# Patient Record
Sex: Female | Born: 1950 | Race: White | Hispanic: No | Marital: Married | Smoking: Never smoker
Health system: Southern US, Community
[De-identification: ages and names within clinical notes are randomized; demographics above are authoritative.]

## PROBLEM LIST (undated history)

## (undated) DIAGNOSIS — M8580 Other specified disorders of bone density and structure, unspecified site: Secondary | ICD-10-CM

## (undated) DIAGNOSIS — M1990 Unspecified osteoarthritis, unspecified site: Secondary | ICD-10-CM

## (undated) DIAGNOSIS — F419 Anxiety disorder, unspecified: Secondary | ICD-10-CM

## (undated) DIAGNOSIS — F32A Depression, unspecified: Secondary | ICD-10-CM

## (undated) DIAGNOSIS — G5793 Unspecified mononeuropathy of bilateral lower limbs: Secondary | ICD-10-CM

## (undated) DIAGNOSIS — F329 Major depressive disorder, single episode, unspecified: Secondary | ICD-10-CM

## (undated) DIAGNOSIS — F338 Other recurrent depressive disorders: Secondary | ICD-10-CM

## (undated) DIAGNOSIS — R7303 Prediabetes: Secondary | ICD-10-CM

## (undated) HISTORY — PX: APPENDECTOMY: SHX54

---

## 2005-06-21 ENCOUNTER — Ambulatory Visit: Payer: Self-pay | Admitting: Unknown Physician Specialty

## 2008-07-07 ENCOUNTER — Ambulatory Visit: Payer: Self-pay | Admitting: Internal Medicine

## 2009-07-20 ENCOUNTER — Ambulatory Visit: Payer: Self-pay | Admitting: Internal Medicine

## 2010-09-18 ENCOUNTER — Ambulatory Visit: Payer: Self-pay | Admitting: Internal Medicine

## 2011-11-13 ENCOUNTER — Ambulatory Visit: Payer: Self-pay | Admitting: Internal Medicine

## 2013-06-22 ENCOUNTER — Ambulatory Visit: Payer: Self-pay | Admitting: Family Medicine

## 2013-12-29 ENCOUNTER — Emergency Department: Payer: Self-pay | Admitting: Emergency Medicine

## 2013-12-29 LAB — URINALYSIS, COMPLETE
BACTERIA: NONE SEEN
Bilirubin,UR: NEGATIVE
Blood: NEGATIVE
Glucose,UR: NEGATIVE mg/dL (ref 0–75)
KETONE: NEGATIVE
Leukocyte Esterase: NEGATIVE
Nitrite: NEGATIVE
Ph: 8 (ref 4.5–8.0)
Protein: NEGATIVE
RBC,UR: 2 /HPF (ref 0–5)
SPECIFIC GRAVITY: 1.014 (ref 1.003–1.030)
Squamous Epithelial: 1
WBC UR: 1 /HPF (ref 0–5)

## 2014-07-19 ENCOUNTER — Other Ambulatory Visit: Payer: Self-pay | Admitting: Family Medicine

## 2014-07-19 DIAGNOSIS — Z1231 Encounter for screening mammogram for malignant neoplasm of breast: Secondary | ICD-10-CM

## 2014-08-02 ENCOUNTER — Other Ambulatory Visit: Payer: Self-pay | Admitting: Family Medicine

## 2014-08-02 ENCOUNTER — Ambulatory Visit
Admission: RE | Admit: 2014-08-02 | Discharge: 2014-08-02 | Disposition: A | Discharge status: Home / Self Care | Payer: Medicare PPO | Source: Ambulatory Visit | Attending: Family Medicine | Admitting: Family Medicine

## 2014-08-02 DIAGNOSIS — Z1231 Encounter for screening mammogram for malignant neoplasm of breast: Secondary | ICD-10-CM | POA: Insufficient documentation

## 2015-06-19 ENCOUNTER — Ambulatory Visit: Payer: Medicare Other | Admitting: Anesthesiology

## 2015-06-19 ENCOUNTER — Encounter: Payer: Self-pay | Admitting: *Deleted

## 2015-06-19 ENCOUNTER — Ambulatory Visit
Admission: RE | Admit: 2015-06-19 | Discharge: 2015-06-19 | Disposition: A | Discharge status: Home / Self Care | Payer: Medicare Other | Source: Ambulatory Visit | Attending: Unknown Physician Specialty | Admitting: Unknown Physician Specialty

## 2015-06-19 ENCOUNTER — Encounter
Admission: RE | Disposition: A | Discharge status: Home / Self Care | Payer: Self-pay | Source: Ambulatory Visit | Attending: Unknown Physician Specialty

## 2015-06-19 DIAGNOSIS — Z791 Long term (current) use of non-steroidal anti-inflammatories (NSAID): Secondary | ICD-10-CM | POA: Insufficient documentation

## 2015-06-19 DIAGNOSIS — D123 Benign neoplasm of transverse colon: Secondary | ICD-10-CM | POA: Insufficient documentation

## 2015-06-19 DIAGNOSIS — Z79899 Other long term (current) drug therapy: Secondary | ICD-10-CM | POA: Diagnosis not present

## 2015-06-19 DIAGNOSIS — K641 Second degree hemorrhoids: Secondary | ICD-10-CM | POA: Insufficient documentation

## 2015-06-19 DIAGNOSIS — Z1211 Encounter for screening for malignant neoplasm of colon: Secondary | ICD-10-CM | POA: Insufficient documentation

## 2015-06-19 DIAGNOSIS — F419 Anxiety disorder, unspecified: Secondary | ICD-10-CM | POA: Diagnosis not present

## 2015-06-19 DIAGNOSIS — Z9049 Acquired absence of other specified parts of digestive tract: Secondary | ICD-10-CM | POA: Diagnosis not present

## 2015-06-19 DIAGNOSIS — M8580 Other specified disorders of bone density and structure, unspecified site: Secondary | ICD-10-CM | POA: Insufficient documentation

## 2015-06-19 DIAGNOSIS — F339 Major depressive disorder, recurrent, unspecified: Secondary | ICD-10-CM | POA: Diagnosis not present

## 2015-06-19 HISTORY — DX: Depression, unspecified: F32.A

## 2015-06-19 HISTORY — DX: Unspecified osteoarthritis, unspecified site: M19.90

## 2015-06-19 HISTORY — DX: Other recurrent depressive disorders: F33.8

## 2015-06-19 HISTORY — DX: Other specified disorders of bone density and structure, unspecified site: M85.80

## 2015-06-19 HISTORY — DX: Anxiety disorder, unspecified: F41.9

## 2015-06-19 HISTORY — PX: COLONOSCOPY WITH PROPOFOL: SHX5780

## 2015-06-19 HISTORY — DX: Major depressive disorder, single episode, unspecified: F32.9

## 2015-06-19 SURGERY — COLONOSCOPY WITH PROPOFOL
Anesthesia: General

## 2015-06-19 MED ORDER — PROPOFOL 500 MG/50ML IV EMUL
INTRAVENOUS | Status: DC | PRN
  Administered 2015-06-19: 125 ug/kg/min via INTRAVENOUS

## 2015-06-19 MED ORDER — SODIUM CHLORIDE 0.9 % IV SOLN: INTRAVENOUS | Status: DC

## 2015-06-19 MED ORDER — SODIUM CHLORIDE 0.9 % IV SOLN
INTRAVENOUS | Status: DC
  Administered 2015-06-19: 1000 mL via INTRAVENOUS

## 2015-06-19 MED ORDER — PROPOFOL 10 MG/ML IV BOLUS
INTRAVENOUS | Status: DC | PRN
  Administered 2015-06-19: 75 mg via INTRAVENOUS

## 2015-06-19 MED ORDER — LACTATED RINGERS IV SOLN
INTRAVENOUS | Status: DC | PRN
  Administered 2015-06-19: 13:00:00 via INTRAVENOUS

## 2015-06-19 NOTE — Anesthesia Preprocedure Evaluation (Signed)
Anesthesia Evaluation  Patient identified by MRN, date of birth, ID band Patient awake    Reviewed: Allergy & Precautions, H&P , NPO status , Patient's Chart, lab work & pertinent test results, reviewed documented beta blocker date and time   Airway Mallampati: III   Neck ROM: full    Dental  (+) Poor Dentition, Teeth Intact   Pulmonary neg pulmonary ROS,    Pulmonary exam normal        Cardiovascular negative cardio ROS Normal cardiovascular exam Rate:Normal     Neuro/Psych PSYCHIATRIC DISORDERS negative neurological ROS  negative psych ROS   GI/Hepatic negative GI ROS, Neg liver ROS,   Endo/Other  negative endocrine ROS  Renal/GU negative Renal ROS  negative genitourinary   Musculoskeletal   Abdominal   Peds  Hematology negative hematology ROS (+)   Anesthesia Other Findings Past Medical History:   Depression                                                   Anxiety                                                      DJD (degenerative joint disease)                             Osteopenia                                                   Seasonal depression (HCC)                                  Past Surgical History:   APPENDECTOMY                                                  Reproductive/Obstetrics                             Anesthesia Physical Anesthesia Plan  ASA: III  Anesthesia Plan: General   Post-op Pain Management:    Induction:   Airway Management Planned:   Additional Equipment:   Intra-op Plan:   Post-operative Plan:   Informed Consent: I have reviewed the patients History and Physical, chart, labs and discussed the procedure including the risks, benefits and alternatives for the proposed anesthesia with the patient or authorized representative who has indicated his/her understanding and acceptance.   Dental Advisory Given  Plan Discussed with:  CRNA  Anesthesia Plan Comments:         Anesthesia Quick Evaluation

## 2015-06-19 NOTE — H&P (Signed)
   Primary Care Physician:  Marcello Fennel, MD Primary Gastroenterologist:  Dr. Vira Agar  Pre-Procedure History & Physical: HPI:  Monica Everett is a 65 y.o. female is here for an colonoscopy.   Past Medical History  Diagnosis Date  . Depression   . Anxiety   . DJD (degenerative joint disease)   . Osteopenia   . Seasonal depression [facility])     Past Surgical History  Procedure Laterality Date  . Appendectomy      Prior to Admission medications   Medication Sig Start Date End Date Taking? Authorizing Provider  Biotin 1 MG CAPS Take by mouth.   Yes Historical Provider, MD  cetirizine (ZYRTEC) 10 MG tablet Take 10 mg by mouth daily.   Yes Historical Provider, MD  citalopram (CELEXA) 10 MG tablet Take 10 mg by mouth daily.   Yes Historical Provider, MD  meloxicam (MOBIC) 15 MG tablet Take 15 mg by mouth daily.   Yes Historical Provider, MD  multivitamin-iron-minerals-folic acid (CENTRUM) chewable tablet Chew 1 tablet by mouth daily.   Yes Historical Provider, MD    Allergies as of 06/03/2015  . (Not on File)    History reviewed. No pertinent family history.  Social History   Social History  . Marital Status: Married    Spouse Name: N/A  . Number of Children: N/A  . Years of Education: N/A   Occupational History  . Not on file.   Social History Main Topics  . Smoking status: Never Smoker   . Smokeless tobacco: Never Used  . Alcohol Use: 3.6 oz/week    6 Glasses of wine per week  . Drug Use: No  . Sexual Activity: Not on file   Other Topics Concern  . Not on file   Social History Narrative    Review of Systems: See HPI, otherwise negative ROS  Physical Exam: BP 122/76 mmHg  Pulse 67  Temp(Src) 98 F (36.7 C) (Oral)  Resp 18  Ht 5\' 5"  (1.651 m)  Wt 67.132 kg (148 lb)  BMI 24.63 kg/m2  SpO2 98% General:   Alert,  pleasant and cooperative in NAD Head:  Normocephalic and atraumatic. Neck:  Supple; no masses or thyromegaly. Lungs:  Clear throughout to  auscultation.    Heart:  Regular rate and rhythm. Abdomen:  Soft, nontender and nondistended. Normal bowel sounds, without guarding, and without rebound.   Neurologic:  Alert and  oriented x4;  grossly normal neurologically.  Impression/Plan: Monica Everett is here for an colonoscopy to be performed for Screening  Risks, benefits, limitations, and alternatives regarding  colonoscopy have been reviewed with the patient.  Questions have been answered.  All parties agreeable.   Gaylyn Cheers, MD  06/19/2015, 1:29 PM

## 2015-06-19 NOTE — Op Note (Signed)
[facility] Gastroenterology Patient Name: Monica Everett Procedure Date: 06/19/2015 1:30 PM MRN: [id number] Account #: [id number] Date of Birth: 1950-04-28 Admit Type: Outpatient Age: 65 Room: [facility] ENDO ROOM 1 Gender: Female Note Status: Finalized Procedure:            Colonoscopy Indications:          Screening for colorectal malignant neoplasm Providers:            Manya Silvas, MD Referring MD:         Caprice Renshaw (Referring MD) Medicines:            Propofol per Anesthesia Complications:        No immediate complications. Procedure:            Pre-Anesthesia Assessment:                       - After reviewing the risks and benefits, the patient                        was deemed in satisfactory condition to undergo the                        procedure.                       After obtaining informed consent, the colonoscope was                        passed under direct vision. Throughout the procedure,                        the patient's blood pressure, pulse, and oxygen                        saturations were monitored continuously. The                        Colonoscope was introduced through the anus and                        advanced to the the cecum, identified by appendiceal                        orifice and ileocecal valve. The colonoscopy was                        performed without difficulty. The patient tolerated the                        procedure well. The quality of the bowel preparation                        was good. Findings:      A diminutive polyp was found in the transverse colon. The polyp was       sessile. The polyp was removed with a jumbo cold forceps. Resection and       retrieval were complete.      Internal hemorrhoids were found during endoscopy. The hemorrhoids were       small and Grade I (internal hemorrhoids that do not prolapse).      The exam was  otherwise without abnormality. Impression:           - One  diminutive polyp in the transverse colon, removed                        with a jumbo cold forceps. Resected and retrieved.                       - Internal hemorrhoids.                       - The examination was otherwise normal. Recommendation:       - Await pathology results. Manya Silvas, MD 06/19/2015 1:58:17 PM This report has been signed electronically. Number of Addenda: 0 Note Initiated On: 06/19/2015 1:30 PM Scope Withdrawal Time: 0 hours 14 minutes 51 seconds  Total Procedure Duration: 0 hours 21 minutes 5 seconds       [facility]

## 2015-06-19 NOTE — Anesthesia Postprocedure Evaluation (Signed)
Anesthesia Post Note  Patient: Monica Everett  Procedure(s) Performed: Procedure(s) (LRB): COLONOSCOPY WITH PROPOFOL (N/A)  Patient location during evaluation: PACU Anesthesia Type: General Level of consciousness: awake Pain management: pain level controlled Vital Signs Assessment: post-procedure vital signs reviewed and stable Respiratory status: spontaneous breathing Cardiovascular status: blood pressure returned to baseline Anesthetic complications: no    Last Vitals:  Filed Vitals:   06/19/15 1420 06/19/15 1430  BP: 132/84 143/76  Pulse: 69 59  Temp:    Resp: 17 12    Last Pain:  Filed Vitals:   06/19/15 1432  PainSc: 2                  VAN STAVEREN,Maytal Mijangos

## 2015-06-19 NOTE — Transfer of Care (Signed)
Immediate Anesthesia Transfer of Care Note  Patient: Monica Everett  Procedure(s) Performed: Procedure(s): COLONOSCOPY WITH PROPOFOL (N/A)  Patient Location: PACU and Endoscopy Unit  Anesthesia Type:General  Level of Consciousness: awake, alert  and oriented  Airway & Oxygen Therapy: Patient Spontanous Breathing and Patient connected to nasal cannula oxygen  Post-op Assessment: Report given to RN and Post -op Vital signs reviewed and stable  Post vital signs: Reviewed and stable  Last Vitals:  Filed Vitals:   06/19/15 1259  BP: 122/76  Pulse: 67  Temp: 36.7 C  Resp: 18    Complications: No apparent anesthesia complications

## 2015-06-20 LAB — SURGICAL PATHOLOGY

## 2015-06-23 ENCOUNTER — Other Ambulatory Visit: Payer: Self-pay | Admitting: Family Medicine

## 2015-06-23 DIAGNOSIS — Z1231 Encounter for screening mammogram for malignant neoplasm of breast: Secondary | ICD-10-CM

## 2015-08-03 ENCOUNTER — Other Ambulatory Visit: Payer: Self-pay | Admitting: Family Medicine

## 2015-08-03 ENCOUNTER — Ambulatory Visit
Admission: RE | Admit: 2015-08-03 | Discharge: 2015-08-03 | Disposition: A | Discharge status: Home / Self Care | Payer: Medicare Other | Source: Ambulatory Visit | Attending: Family Medicine | Admitting: Family Medicine

## 2015-08-03 DIAGNOSIS — Z1231 Encounter for screening mammogram for malignant neoplasm of breast: Secondary | ICD-10-CM

## 2016-07-15 ENCOUNTER — Other Ambulatory Visit: Payer: Self-pay | Admitting: Family Medicine

## 2016-07-15 DIAGNOSIS — Z1231 Encounter for screening mammogram for malignant neoplasm of breast: Secondary | ICD-10-CM

## 2016-08-06 ENCOUNTER — Ambulatory Visit
Admission: RE | Admit: 2016-08-06 | Discharge: 2016-08-06 | Disposition: A | Discharge status: Home / Self Care | Payer: Medicare Other | Source: Ambulatory Visit | Attending: Family Medicine | Admitting: Family Medicine

## 2016-08-06 DIAGNOSIS — Z1231 Encounter for screening mammogram for malignant neoplasm of breast: Secondary | ICD-10-CM | POA: Insufficient documentation

## 2016-10-08 ENCOUNTER — Ambulatory Visit: Payer: Medicare Other | Attending: Family Medicine | Admitting: Physical Therapy

## 2016-10-08 ENCOUNTER — Encounter: Payer: Self-pay | Admitting: Physical Therapy

## 2016-10-08 DIAGNOSIS — M25511 Pain in right shoulder: Secondary | ICD-10-CM | POA: Insufficient documentation

## 2016-10-08 DIAGNOSIS — M6281 Muscle weakness (generalized): Secondary | ICD-10-CM | POA: Insufficient documentation

## 2016-10-09 NOTE — Therapy (Signed)
[facility] [address] [place], [address] Phone: [phone]   Fax:  [phone]  Physical Therapy Evaluation  Patient Details  Name: MINTIE WITHERINGTON MRN: [id number] Date of Birth: [birth year] Referring Provider: Derinda Late MD  Encounter Date: 10/08/2016      PT End of Session - 10/08/16 1233    Visit Number 1   Number of Visits 12   Date for PT Re-Evaluation 11/19/16   Authorization Type 1   Authorization Time Period 10 G code   PT Start Time 1117   PT Stop Time 1213   PT Time Calculation (min) 56 min   Activity Tolerance Patient tolerated treatment well   Behavior During Therapy [facility] for tasks assessed/performed      Past Medical History:  Diagnosis Date  . Anxiety   . Depression   . DJD (degenerative joint disease)   . Osteopenia   . Seasonal depression ([place])     Past Surgical History:  Procedure Laterality Date  . APPENDECTOMY    . COLONOSCOPY WITH PROPOFOL N/A 06/19/2015   Procedure: COLONOSCOPY WITH PROPOFOL;  Surgeon: Manya Silvas, MD;  Location: [facility] ENDOSCOPY;  Service: Endoscopy;  Laterality: N/A;    There were no vitals filed for this visit.       Subjective Assessment - 10/08/16 1130    Subjective pain in right shoulder that is improving since injection last week. still feels weak and can't put right arm behind back as well as her left.    Pertinent History ~ 66month ago patient reports gradual onset of pain in right shoulder for no apparent reason. She has been self managing pain until seen by MD and received an injection with good results. No previous history of shoulder pain.    Limitations Lifting;House hold activities;Other (comment)  placing right arm behind back   Patient Stated Goals improve strength to be able to return to exercises at gym and to know what to do to not re injure right shoulder   Currently in Pain? No/denies  Pain ranges from 0/10 up to 4/10             [facility] PT Assessment - 10/08/16 1131      Assessment   Medical Diagnosis Acute pain of right shoulder M25.511   Referring Provider Derinda Late MD   Onset Date/Surgical Date 09/01/16   Hand Dominance Right   Next MD Visit none   Prior Therapy none for this episode right shoulder     Precautions   Precautions None     Balance Screen   Has the patient fallen in the past 6 months No     Prior Function   Level of Independence Independent   Vocation Retired   Leisure exercise, reading, taking care of dog     Cognition   Overall Cognitive Status Within Functional Limits for tasks assessed     Observation/Other Assessments   Quick DASH  14% self perceived disability (mild)     Sensation   Light Touch Appears Intact     Posture/Postural Control   Posture Comments mild forward head with rounded shoulders,      ROM / Strength   AROM / PROM / Strength AROM;Strength     AROM   Overall AROM Comments limited IR right shoulder behind back, all other AROM WNL right and left UE     Strength   Overall Strength Comments left UE 5/5 major muscle groups, right  shoulder decreased ER 4/5; decreased scapular control, stability with lower trapezius     Palpation   Palpation comment + muscle spasms palpable right shoulder posterior aspect over Teres muscles and latissimus muscles     Neer Impingement test    Findings Negative   Side Right     Empty Can test   Findings Negative   Side Right            Objective measurements completed on examination: See above findings.    Treatment: Therapeutic exercises: instruction with VC, tactile cues, demonstration of therapist: goal: improve Quick Dash score, independent with home program Supine lying: Serratus punches x 10 Scapular retraction x 10 Chest press, overhead raise with 3# weight x 15 reps Standing: Bilateral shoulder rows high with green resistive band x 10 reps Bilateral shoulder extension to hips x 10  with green resistive band x 10 reps  Manual therapy: goal: improve soft tissue elasticity, decreased spasms, pain STM performed to right shoulder upper trapezius and posterior aspect of right shoulder with patient sitting  Patient response to treatment: patient demonstrated improved technique with exercises with minimal VC for correct alignment.  Improved motor control with repetition and cuing.         PT Education - 10/08/16 1226    Education provided Yes   Education Details POC; HEP: scpapular retraction, scapular rows with green resistive band high and shoulder extension to hips, precautions for impingment   Person(s) Educated Patient   Methods Explanation;Demonstration;Verbal cues;Handout   Comprehension Verbalized understanding;Returned demonstration;Verbal cues required             PT Long Term Goals - 10/08/16 1200      PT LONG TERM GOAL #1   Title Patient will demonstrate improved function and decreased pain in shoulder as indicated by QuickDash score of 10% or less   Baseline QuickDash 14%   Status New   Target Date 11/19/16     PT LONG TERM GOAL #2   Title Patient will be independent with home program for pain control, exercises for flexibility and strength to allow transition to self management once discharged from physical therapy   Baseline limited knowledge of pain control, appropriate exercises and progression without assistance and cuing   Status New   Target Date 11/19/16                Plan - 10/08/16 1235    Clinical Impression Statement Patient is a 66 year old right hand dominant female who presents with right shoulder pain x ~1 month and is improving at this time. She has limited IR behind back and pain that limits full functional use of right UE. Patient has decreased strength right shoulder scapular control, lower trapezius muscle, external rotator muscles. Her QuickDash score of  14% indicating mild self pervceived disabiltiy. She has  limited knowledge of appropriate precautions, exercises or progression and will benefit from physical therapy intervention to achieve prior level of function.    History and Personal Factors relevant to plan of care: gradual onset of pain right shoulder with limited motion, function that is improving since onset and injection received a week ago.    Clinical Presentation Stable   Clinical Presentation due to: improving at this time, not worsening   Clinical Decision Making Low   Rehab Potential Good   Clinical Impairments Affecting Rehab Potential (+)motivated, prior level of function, acute condition   PT Frequency Other (comment)  1-2x/week   PT Duration 6 weeks  PT Treatment/Interventions Manual techniques;Patient/family education;Neuromuscular re-education;Therapeutic exercise;Moist Heat;Ultrasound;Iontophoresis 4mg /ml Dexamethasone;Electrical Stimulation;Cryotherapy   PT Next Visit Plan pain control, neuromuscular re education, progressive exercise   PT Home Exercise Plan posture awareness, scapular retraction with/without resistance, scapular punches and flexion overhead supine lying   Consulted and Agree with Plan of Care Patient      Patient will benefit from skilled therapeutic intervention in order to improve the following deficits and impairments:  Decreased strength, Impaired perceived functional ability, Pain, Increased muscle spasms, Decreased range of motion, Impaired UE functional use  Visit Diagnosis: Acute pain of right shoulder - Plan: PT plan of care cert/re-cert  Muscle weakness (generalized) - Plan: PT plan of care cert/re-cert      G-Codes - 07/62/26 1247    Functional Assessment Tool Used (Outpatient Only) quickDash, ROM, strength, pain, clinical judgment   Functional Limitation Carrying, moving and handling objects   Carrying, Moving and Handling Objects Current Status (J3354) At least 20 percent but less than 40 percent impaired, limited or restricted    Carrying, Moving and Handling Objects Goal Status (T6256) At least 1 percent but less than 20 percent impaired, limited or restricted       Problem List There are no active problems to display for this patient.   Jomarie Longs PT 10/09/2016, 6:27 PM  [facility] [address] [place], [address] Phone: [phone]   Fax:  [phone]  Name: ANALIZA COWGER MRN: [id number] Date of Birth: [birth year]

## 2016-10-10 ENCOUNTER — Ambulatory Visit: Payer: Medicare Other | Admitting: Physical Therapy

## 2016-10-10 ENCOUNTER — Encounter: Payer: Self-pay | Admitting: Physical Therapy

## 2016-10-10 DIAGNOSIS — M25511 Pain in right shoulder: Secondary | ICD-10-CM | POA: Diagnosis not present

## 2016-10-10 DIAGNOSIS — M6281 Muscle weakness (generalized): Secondary | ICD-10-CM

## 2016-10-11 NOTE — Therapy (Signed)
[facility] [address], [place], [address] Phone: [phone]   Fax:  [phone]  Physical Therapy Treatment  Patient Details  Name: Monica Everett MRN: [id number] Date of Birth: Nov 15, 1950 Referring Provider: Derinda Late MD  Encounter Date: 10/10/2016      PT End of Session - 10/10/16 1203    Visit Number 2   Number of Visits 12   Date for PT Re-Evaluation 11/19/16   Authorization Type 2   Authorization Time Period 10 G code   PT Start Time 1120   PT Stop Time 1149   PT Time Calculation (min) 29 min   Activity Tolerance Patient tolerated treatment well   Behavior During Therapy [facility] for tasks assessed/performed      Past Medical History:  Diagnosis Date  . Anxiety   . Depression   . DJD (degenerative joint disease)   . Osteopenia   . Seasonal depression ([place])     Past Surgical History:  Procedure Laterality Date  . APPENDECTOMY    . COLONOSCOPY WITH PROPOFOL N/A 06/19/2015   Procedure: COLONOSCOPY WITH PROPOFOL;  Surgeon: Manya Silvas, MD;  Location: [facility] ENDOSCOPY;  Service: Endoscopy;  Laterality: N/A;    There were no vitals filed for this visit.      Subjective Assessment - 10/10/16 1125    Subjective Patient reports she is sore 2/10 today and has been doing cleaning at home and water aerobics 3x/week; modified exercises with good results   Pertinent History ~ 87month ago patient reports gradual onset of pain in right shoulder for no apparent reason. She has been self managing pain until seen by MD and received an injection with good results. No previous history of shoulder pain.    Limitations Lifting;House hold activities;Other (comment)  placing right arm behind back   Patient Stated Goals improve strength to be able to return to exercises at gym and to know what to do to not re injure right shoulder   Currently in Pain? Yes   Pain Score 2    Pain Location Shoulder   Pain  Orientation Right   Pain Descriptors / Indicators Aching;Dull   Pain Type Acute pain   Pain Onset 1 to 4 weeks ago   Pain Frequency Intermittent        Objective: Posture: forward shoulder right as compared to left, left scapula higher than right with spasms in upper trapezius Strength: decreased periscapular motor control right and left with decreased strength lower trapezius and rhomboids  Therapeutic exercise: patient performed exercises with verbal, tactile cues and demonstration of therapist: Standing: Resistive band scapular rows at door green resistive band 15 reps Straight arm pull downs to hips x 15 reps Scapular retraction at door frame x 10 with VC for improved control through full ROM Supine lying chest press with 3# weight x 15 Forward flexion supine with 3# weight x 15 Serratus punches following scapular control exercises to improve proper technique and shoulder alignment 10 reps Side lying left and right for scapular control exercises with manual resistance, cuing to perform through full ROM with good alignment 10 reps each of elevation/depression and protraction/retraction   Patient response to treatment: patient demonstrated improved technique with exercises with moderate tactile and VC for correct alignment. Patient with decreased pain from   2/10 to  0/10.  Improved motor control with repetition and cuing           PT Education - 10/10/16 1128  Education provided Yes   Education Details HEP re assessed with resistive bands   Person(s) Educated Patient   Methods Explanation;Demonstration;Verbal cues   Comprehension Verbalized understanding;Returned demonstration;Verbal cues required             PT Long Term Goals - 10/08/16 1200      PT LONG TERM GOAL #1   Title Patient will demonstrate improved function and decreased pain in shoulder as indicated by QuickDash score of 10% or less   Baseline QuickDash 14%   Status New   Target Date 11/19/16      PT LONG TERM GOAL #2   Title Patient will be independent with home program for pain control, exercises for flexibility and strength to allow transition to self management once discharged from physical therapy   Baseline limited knowledge of pain control, appropriate exercises and progression without assistance and cuing   Status New   Target Date 11/19/16               Plan - 10/10/16 1200    Clinical Impression Statement Patient demonstrated improved posture, alignment with exercises with repetition and repeated demonstration. she is progressing towards goals and should continue to improve with additional physical therapy intervention to address functional limitaitons.    Rehab Potential Good   Clinical Impairments Affecting Rehab Potential (+)motivated, prior level of function, acute condition   PT Frequency Other (comment)  1-2x/week   PT Duration 6 weeks   PT Treatment/Interventions Manual techniques;Patient/family education;Neuromuscular re-education;Therapeutic exercise;Moist Heat;Ultrasound;Iontophoresis 4mg /ml Dexamethasone;Electrical Stimulation;Cryotherapy   PT Next Visit Plan pain control, neuromuscular re education, progressive exercise   PT Home Exercise Plan posture awareness, scapular retraction with/without resistance, scapular punches and flexion overhead supine lying      Patient will benefit from skilled therapeutic intervention in order to improve the following deficits and impairments:  Decreased strength, Impaired perceived functional ability, Pain, Increased muscle spasms, Decreased range of motion, Impaired UE functional use  Visit Diagnosis: Acute pain of right shoulder  Muscle weakness (generalized)     Problem List There are no active problems to display for this patient.   Jomarie Longs PT 10/11/2016, 10:26 AM  [facility] [address], [place], [address] Phone:  [phone]   Fax:  [phone]  Name: Monica Everett MRN: [id number] Date of Birth: [birth year]

## 2016-10-14 ENCOUNTER — Ambulatory Visit: Payer: Medicare Other | Admitting: Physical Therapy

## 2016-10-15 ENCOUNTER — Ambulatory Visit: Payer: Medicare Other | Admitting: Physical Therapy

## 2016-10-15 ENCOUNTER — Encounter: Payer: Self-pay | Admitting: Physical Therapy

## 2016-10-15 DIAGNOSIS — M25511 Pain in right shoulder: Secondary | ICD-10-CM

## 2016-10-15 DIAGNOSIS — M6281 Muscle weakness (generalized): Secondary | ICD-10-CM

## 2016-10-16 ENCOUNTER — Encounter: Payer: Medicare PPO | Admitting: Physical Therapy

## 2016-10-16 NOTE — Therapy (Signed)
[facility] [address] [place], [address] Phone: [phone]   Fax:  [phone]  Physical Therapy Treatment  Patient Details  Name: Monica Everett MRN: [id number] Date of Birth: 11-17-1950 Referring Provider: Derinda Late MD  Encounter Date: 10/15/2016      PT End of Session - 10/15/16 1230    Visit Number 3   Number of Visits 12   Date for PT Re-Evaluation 11/19/16   Authorization Type 3   Authorization Time Period 10 G code   PT Start Time 1118   PT Stop Time 1200   PT Time Calculation (min) 42 min   Activity Tolerance Patient tolerated treatment well   Behavior During Therapy [facility] for tasks assessed/performed      Past Medical History:  Diagnosis Date  . Anxiety   . Depression   . DJD (degenerative joint disease)   . Osteopenia   . Seasonal depression ([place])     Past Surgical History:  Procedure Laterality Date  . APPENDECTOMY    . COLONOSCOPY WITH PROPOFOL N/A 06/19/2015   Procedure: COLONOSCOPY WITH PROPOFOL;  Surgeon: Manya Silvas, MD;  Location: [facility] ENDOSCOPY;  Service: Endoscopy;  Laterality: N/A;    There were no vitals filed for this visit.      Subjective Assessment - 10/15/16 1120    Subjective Patient reports she is continuing to exercise and is seeing improvement with all aspects of function.    Pertinent History ~ 7month ago patient reports gradual onset of pain in right shoulder for no apparent reason. She has been self managing pain until seen by MD and received an injection with good results. No previous history of shoulder pain.    Limitations Lifting;House hold activities;Other (comment)  placing right arm behind back   Patient Stated Goals improve strength to be able to return to exercises at gym and to know what to do to not re injure right shoulder   Currently in Pain? Yes   Pain Score 1    Pain Location Shoulder   Pain Orientation Right   Pain  Descriptors / Indicators Sore   Pain Type Acute pain   Pain Onset 1 to 4 weeks ago   Pain Frequency Intermittent        Objective: Posture: forward shoulder right as compared to left, left scapula higher than right with spasms in upper trapezius Palpation; + spasms right shoulder medial border of scapula and along lats/teres muscles Strength: decreased periscapular motor control right and left with decreased strength lower trapezius and rhomboids  Treatment:  Manual therapy: 15 min.: goal: spasms/pain STM with superficial techniques performed to right scapula/rhomboids with mobilization of scapula with patient side lying left; superficial techniques; supine lying performed STM to lats. Teres muscles in conjunction with exercises to decrease pain and improve mobility   Therapeutic exercise: patient performed exercises with verbal, tactile cues and demonstration of therapist: Goal: independent with home program, improve QuickDash Standing at [facility] cable machine: required VC, tactile cues to perform with correct shoulder alignment and decrease substitution scapular rows 10# bilateral 15 reps Straight arm pull downs to hips 15# x 15 reps  At wall:  Partial ROM push ups with controlled motion and hand placement lower to decrease right shoulder pain 10 reps  Scapular retraction at door frame x 10 with VC for improved control through full ROM Supine lying chest press with 3# weight x 15 Forward flexion supine with 3# weight x 15  Side lying right for left  scapular control exercises with manual resistance, cuing to perform through full ROM with good alignment 10 reps protraction/retraction   Patient response to treatment: Patient with decreased spasms and pain by 50% following STM to right shoulder girdle with decreased tenderness along medial border of scapula and improved motor control with exercises.  Improved motor control with all exercises with repetition and cuing          PT  Education - 10/15/16 1233    Education provided Yes   Education Details exercise instruction for technique, alignment    Person(s) Educated Patient   Methods Explanation;Demonstration;Verbal cues;Tactile cues   Comprehension Verbalized understanding;Returned demonstration;Verbal cues required             PT Long Term Goals - 10/08/16 1200      PT LONG TERM GOAL #1   Title Patient will demonstrate improved function and decreased pain in shoulder as indicated by QuickDash score of 10% or less   Baseline QuickDash 14%   Status New   Target Date 11/19/16     PT LONG TERM GOAL #2   Title Patient will be independent with home program for pain control, exercises for flexibility and strength to allow transition to self management once discharged from physical therapy   Baseline limited knowledge of pain control, appropriate exercises and progression without assistance and cuing   Status New   Target Date 11/19/16               Plan - 10/15/16 1218    Clinical Impression Statement Patient demonstrates improvement with posture, technique and motor control with moderate cuing and STM for decreasing spasms. She is progressing steadily towards goals and will benefit from continued physical therapy intervention to achieve goals.    Rehab Potential Good   Clinical Impairments Affecting Rehab Potential (+)motivated, prior level of function, acute condition   PT Frequency Other (comment)  1-2x/week   PT Duration 6 weeks   PT Treatment/Interventions Manual techniques;Patient/family education;Neuromuscular re-education;Therapeutic exercise;Moist Heat;Ultrasound;Iontophoresis 4mg /ml Dexamethasone;Electrical Stimulation;Cryotherapy   PT Next Visit Plan pain control, neuromuscular re education, progressive exercise   PT Home Exercise Plan posture awareness, scapular retraction with/without resistance, scapular punches and flexion overhead supine lying      Patient will benefit from  skilled therapeutic intervention in order to improve the following deficits and impairments:  Decreased strength, Impaired perceived functional ability, Pain, Increased muscle spasms, Decreased range of motion, Impaired UE functional use  Visit Diagnosis: Acute pain of right shoulder  Muscle weakness (generalized)     Problem List There are no active problems to display for this patient.   Jomarie Longs PT 10/16/2016, 4:23 PM  [facility] [address] [place], [address] Phone: [phone]   Fax:  [phone]  Name: Monica Everett MRN: [id number] Date of Birth: 01/02/1951

## 2016-10-17 ENCOUNTER — Encounter: Payer: Self-pay | Admitting: Physical Therapy

## 2016-10-17 ENCOUNTER — Ambulatory Visit: Payer: Medicare Other | Admitting: Physical Therapy

## 2016-10-17 DIAGNOSIS — M25511 Pain in right shoulder: Secondary | ICD-10-CM | POA: Diagnosis not present

## 2016-10-17 DIAGNOSIS — M6281 Muscle weakness (generalized): Secondary | ICD-10-CM

## 2016-10-17 NOTE — Therapy (Signed)
[facility] [address], [place], [address] Phone: [phone]   Fax:  [phone]  Physical Therapy Treatment  Patient Details  Name: Monica Everett MRN: [id number] Date of Birth: 11/27/1950 Referring Provider: Derinda Late MD  Encounter Date: 10/17/2016      PT End of Session - 10/17/16 1213    Visit Number 4   Number of Visits 12   Date for PT Re-Evaluation 11/19/16   Authorization Type 4   Authorization Time Period 10 G code   PT Start Time 1122   PT Stop Time 1205   PT Time Calculation (min) 43 min   Activity Tolerance Patient tolerated treatment well   Behavior During Therapy [facility] for tasks assessed/performed      Past Medical History:  Diagnosis Date  . Anxiety   . Depression   . DJD (degenerative joint disease)   . Osteopenia   . Seasonal depression ([place])     Past Surgical History:  Procedure Laterality Date  . APPENDECTOMY    . COLONOSCOPY WITH PROPOFOL N/A 06/19/2015   Procedure: COLONOSCOPY WITH PROPOFOL;  Surgeon: Manya Silvas, MD;  Location: [facility] ENDOSCOPY;  Service: Endoscopy;  Laterality: N/A;    There were no vitals filed for this visit.      Subjective Assessment - 10/17/16 1124    Subjective Patient reprots she is exercising and is continuing to see improvement    Pertinent History ~ 9month ago patient reports gradual onset of pain in right shoulder for no apparent reason. She has been self managing pain until seen by MD and received an injection with good results. No previous history of shoulder pain.    Limitations Lifting;House hold activities;Other (comment)  placing right arm behind back   Patient Stated Goals improve strength to be able to return to exercises at gym and to know what to do to not re injure right shoulder   Currently in Pain? Yes   Pain Score 1    Pain Location Shoulder   Pain Orientation Right   Pain Descriptors / Indicators Sore   Pain  Type Acute pain   Pain Onset 1 to 4 weeks ago   Pain Frequency Intermittent       Objective: Posture: forward shoulder right as compared to left Palpation; + spasms right shoulder medial border of scapula decreased as compared to previous session and improved scapular mobility noted Strength: decreased periscapular motor control right and left with decreased strength lower trapezius and rhomboids  Treatment:  Manual therapy: in conjunction with exercises:  STM with superficial techniques performed to right scapula/rhomboids with mobilization of scapula with patient side lying left; superficial techniques Therapeutic exercise: patient performed exercises with verbal, tactile cues and demonstration of therapist: Goal: independent with home program, improve QuickDash [employer] cable machine: required VC, tactile cues to perform with correct shoulder alignment and decrease substitution Seated scapular rows 10# bilateral 15 reps Straight arm pull downs standing: to hips 15# x 15 reps  At wall:  Partial ROM push ups with controlled motion and hand placement lower to decrease right shoulder pain 10 reps  Scapular retraction at door frame x 10 with VC for improved control through full ROM  Side lying right for left  scapular control exercises with manual resistance, cuing to perform through full ROM with good alignment 10 reps protraction/retraction   Modalities: Electrical stimulation: 15 min. Russian stim. 10/10 cycle applied (4) electrodes to right shoulder: (2)posterior aspect over  external rotators and (2)along scapula and lower trapezius muscle:  goal muscle re education   Patient response to treatment: Patient demonstrated improved motor control and technique with repetition and tactile, verbal cuing. Patient reported no pain at end of session and verbalized good understanding of home program to encourage scapular control, retraction right shoulder with exercises and daily tasks.                 PT Education - 10/17/16 1200    Education provided Yes   Education Details exercise instruction with facilitation of correct muscles   Person(s) Educated Patient   Methods Explanation;Demonstration;Tactile cues;Verbal cues   Comprehension Verbalized understanding;Returned demonstration;Verbal cues required             PT Long Term Goals - 10/08/16 1200      PT LONG TERM GOAL #1   Title Patient will demonstrate improved function and decreased pain in shoulder as indicated by QuickDash score of 10% or less   Baseline QuickDash 14%   Status New   Target Date 11/19/16     PT LONG TERM GOAL #2   Title Patient will be independent with home program for pain control, exercises for flexibility and strength to allow transition to self management once discharged from physical therapy   Baseline limited knowledge of pain control, appropriate exercises and progression without assistance and cuing   Status New   Target Date 11/19/16               Plan - 10/17/16 1230    Clinical Impression Statement Patient demonstrates steady improvement towards goals with improving strength, motor control and functional use with less difficulty. she continues to require instruction and moderate cuing to perform exercises with appropriate intensity and technique to activate weaker muscles.    Rehab Potential Good   Clinical Impairments Affecting Rehab Potential (+)motivated, prior level of function, acute condition   PT Frequency Other (comment)  1-2x/week   PT Duration 6 weeks   PT Treatment/Interventions Manual techniques;Patient/family education;Neuromuscular re-education;Therapeutic exercise;Moist Heat;Ultrasound;Iontophoresis 4mg /ml Dexamethasone;Electrical Stimulation;Cryotherapy   PT Next Visit Plan pain control, neuromuscular re education, progressive exercise   PT Home Exercise Plan posture awareness, scapular retraction with/without resistance, scapular punches  and flexion overhead supine lying      Patient will benefit from skilled therapeutic intervention in order to improve the following deficits and impairments:  Decreased strength, Impaired perceived functional ability, Pain, Increased muscle spasms, Decreased range of motion, Impaired UE functional use  Visit Diagnosis: Acute pain of right shoulder  Muscle weakness (generalized)     Problem List There are no active problems to display for this patient.   Jomarie Longs PT 10/18/2016, 12:08 PM  [facility] [address] [place], [address] Phone: [phone]   Fax:  [phone]  Name: CANDY LEVERETT MRN: [id number] Date of Birth: 1950/08/13

## 2016-10-22 ENCOUNTER — Encounter: Payer: Medicare PPO | Admitting: Physical Therapy

## 2016-10-22 ENCOUNTER — Ambulatory Visit: Payer: Medicare Other | Admitting: Physical Therapy

## 2016-10-29 ENCOUNTER — Encounter: Payer: Self-pay | Admitting: Physical Therapy

## 2016-10-29 ENCOUNTER — Encounter: Payer: Medicare PPO | Admitting: Physical Therapy

## 2016-10-29 ENCOUNTER — Ambulatory Visit: Payer: Medicare Other | Admitting: Physical Therapy

## 2016-10-29 DIAGNOSIS — M25511 Pain in right shoulder: Secondary | ICD-10-CM

## 2016-10-29 DIAGNOSIS — M6281 Muscle weakness (generalized): Secondary | ICD-10-CM

## 2016-10-29 NOTE — Therapy (Signed)
[facility] [address] [place], [address] Phone: [phone]   Fax:  [phone]  Physical Therapy Treatment  Patient Details  Name: Monica Everett MRN: [id number] Date of Birth: 1950-12-03 Referring Provider: Derinda Late MD  Encounter Date: 10/29/2016      PT End of Session - 10/29/16 1733    Visit Number 5   Number of Visits 12   Date for PT Re-Evaluation 11/19/16   Authorization Type 5   Authorization Time Period 10 G code   PT Start Time 2585   PT Stop Time 1435   PT Time Calculation (min) 42 min   Activity Tolerance Patient tolerated treatment well   Behavior During Therapy [facility] for tasks assessed/performed      Past Medical History:  Diagnosis Date  . Anxiety   . Depression   . DJD (degenerative joint disease)   . Osteopenia   . Seasonal depression ([place])     Past Surgical History:  Procedure Laterality Date  . APPENDECTOMY    . COLONOSCOPY WITH PROPOFOL N/A 06/19/2015   Procedure: COLONOSCOPY WITH PROPOFOL;  Surgeon: Manya Silvas, MD;  Location: [facility] ENDOSCOPY;  Service: Endoscopy;  Laterality: N/A;    There were no vitals filed for this visit.      Subjective Assessment - 10/29/16 1357    Subjective Patient reports she is exercising and is continuing to see improvement with increasing strength and functional use right UE.   Pertinent History ~ 25month ago patient reports gradual onset of pain in right shoulder for no apparent reason. She has been self managing pain until seen by MD and received an injection with good results. No previous history of shoulder pain.    Limitations Lifting;House hold activities;Other (comment)  placing right arm behind back   Patient Stated Goals improve strength to be able to return to exercises at gym and to know what to do to not re injure right shoulder   Currently in Pain? No/denies      Objective: Strength: decreased periscapular motor  control right and left with decreased strength lower trapezius and rhomboids  Treatment:  Therapeutic exercise: patient performed exercises with verbal, tactile cues and demonstration of therapist: Goal: independent with home program, improve QuickDash OME[employer] cable machine: required VC, tactile cues to perform with correct shoulder alignment and decrease substitution Seated scapular rows 15# bilateral 15 reps Straight arm pull downs standing: to hips 15# x 15 reps Reverse seated chin ups 15# x 15 reps Body blade with small blade chest press x 30 seconds, press down in front x 30 seconds  At wall:  Partial ROM push ups with controlled motion and hand placement lower to decrease right shoulder pain 10 reps ER bilateral shoulders with towel under each arm: ER palms up and perpendular holding (2) 3# weights x 10 reps each  Lower trapezius raise off wall, 10x each UE then 10x bilateral UE's  Modalities: Electrical stimulation: 15 min. Russian stim. 10/10 cycle applied (4) electrodes to right shoulder: (2)posterior aspect over external rotators and (2)along scapula and lower trapezius muscle:  goal muscle re education   Patient response to treatment: Patient demonstrated improved technique with exercises with minimal cuing. No adverse effects with estim., improved muscle contraction with estim.        PT Education - 10/29/16 1732    Education provided Yes   Education Details exercise instruction with correct technique and to facilitate correct muscles   Person(s)  Educated Patient   Methods Explanation;Demonstration;Tactile cues;Verbal cues   Comprehension Verbalized understanding;Returned demonstration;Verbal cues required             PT Long Term Goals - 10/08/16 1200      PT LONG TERM GOAL #1   Title Patient will demonstrate improved function and decreased pain in shoulder as indicated by QuickDash score of 10% or less   Baseline QuickDash 14%   Status New   Target  Date 11/19/16     PT LONG TERM GOAL #2   Title Patient will be independent with home program for pain control, exercises for flexibility and strength to allow transition to self management once discharged from physical therapy   Baseline limited knowledge of pain control, appropriate exercises and progression without assistance and cuing   Status New   Target Date 11/19/16               Plan - 10/29/16 1435    Clinical Impression Statement Patieint demosntrates steady improvement towards goals with improving strength, motor control and functional use with less difficulty. She continues to require instruction, guidance and moderate cuing to perform exercises.    Rehab Potential Good   Clinical Impairments Affecting Rehab Potential (+)motivated, prior level of function, acute condition   PT Frequency Other (comment)  1-2x/week   PT Duration 6 weeks   PT Treatment/Interventions Manual techniques;Patient/family education;Neuromuscular re-education;Therapeutic exercise;Moist Heat;Ultrasound;Iontophoresis 4mg /ml Dexamethasone;Electrical Stimulation;Cryotherapy   PT Next Visit Plan pain control, neuromuscular re education, progressive exercise   PT Home Exercise Plan posture awareness, scapular retraction with/without resistance, scapular punches and flexion overhead supine lying      Patient will benefit from skilled therapeutic intervention in order to improve the following deficits and impairments:  Decreased strength, Impaired perceived functional ability, Pain, Increased muscle spasms, Decreased range of motion, Impaired UE functional use  Visit Diagnosis: Acute pain of right shoulder  Muscle weakness (generalized)     Problem List There are no active problems to display for this patient.   Jomarie Longs PT 10/29/2016, 5:44 PM  [facility] [address] [place], [address] Phone: [phone]   Fax:   [phone]  Name: Monica Everett MRN: [id number] Date of Birth: [birth year]

## 2016-10-31 ENCOUNTER — Encounter: Payer: Self-pay | Admitting: Physical Therapy

## 2016-10-31 ENCOUNTER — Encounter: Payer: Medicare PPO | Admitting: Physical Therapy

## 2016-10-31 ENCOUNTER — Ambulatory Visit: Payer: Medicare Other | Admitting: Physical Therapy

## 2016-10-31 DIAGNOSIS — M6281 Muscle weakness (generalized): Secondary | ICD-10-CM

## 2016-10-31 DIAGNOSIS — M25511 Pain in right shoulder: Secondary | ICD-10-CM | POA: Diagnosis not present

## 2016-10-31 NOTE — Therapy (Signed)
[place] [address] [place], [address] Phone: [phone]   Fax:  [phone]  Physical Therapy Treatment  Patient Details  Name: Monica Everett MRN: [id number] Date of Birth: 1950-04-18 Referring Provider: Derinda Late MD  Encounter Date: 10/31/2016      PT End of Session - 10/31/16 1350    Visit Number 6   Number of Visits 12   Date for PT Re-Evaluation 11/19/16   Authorization Type 6   Authorization Time Period 10 G code   PT Start Time 5681   PT Stop Time 1434   PT Time Calculation (min) 49 min   Activity Tolerance Patient tolerated treatment well   Behavior During Therapy [facility] for tasks assessed/performed      Past Medical History:  Diagnosis Date  . Anxiety   . Depression   . DJD (degenerative joint disease)   . Osteopenia   . Seasonal depression ([facility])     Past Surgical History:  Procedure Laterality Date  . APPENDECTOMY    . COLONOSCOPY WITH PROPOFOL N/A 06/19/2015   Procedure: COLONOSCOPY WITH PROPOFOL;  Surgeon: Manya Silvas, MD;  Location: [facility] ENDOSCOPY;  Service: Endoscopy;  Laterality: N/A;    There were no vitals filed for this visit.      Subjective Assessment - 10/31/16 1348    Subjective patient reports she did well following previous session and is still having aching in right shoulder with certain movements and weight bearing.    Pertinent History ~ 62month ago patient reports gradual onset of pain in right shoulder for no apparent reason. She has been self managing pain until seen by MD and received an injection with good results. No previous history of shoulder pain.    Limitations Lifting;House hold activities;Other (comment)  placing right arm behind back   Patient Stated Goals improve strength to be able to return to exercises at gym and to know what to do to not re injure right shoulder   Currently in Pain? No/denies      Objective: Strength:  decreased periscapular motor control right and left with decreased strength lower trapezius and rhomboids  Treatment:  Therapeutic exercise: patient performed exercises with verbal, tactile cues and demonstration of therapist: Goal: independent with home program, improve QuickDash OMEGA cable machine: required VC, tactile cues to perform with correct shoulder alignment and decrease substitution Seated scapular rows 15# bilateral 15 reps Straight arm pull downs standing: to hips 15# x 15 reps Reverse seated chin ups 15# x 15 reps Body blade with small blade chest press x 30 seconds, press down in front x 30 seconds Stretches for triceps, "tree hug" 3 reps x 10 seconds  At wall:  ER bilateral shoulders with towel under each arm: ER palms up and perpendular holding (2) 2# weights x 10 reps each   Side lying left: performed with RIGHT shoulder with assist of therapist: Shoulder elevation and depression and protraction and retraction (with moderate manual resistance) x 10 reps each  Modalities: Electrical stimulation: 15 min.Russian stim. 10/10 cycle applied (4) electrodes to right shoulder: (2)posterior aspect over external rotators and (2)along scapula and lower trapezius muscle: goal muscle re education   Patient response to treatment: Patient demonstrated improved technique with exercises with minimal cuing.     Patient education: Yes: exercise instruction, stretching Methods: demonstration, explanation, VC Patient returned demonstration with VC          PT Long Term Goals - 10/08/16 1200  PT LONG TERM GOAL #1   Title Patient will demonstrate improved function and decreased pain in shoulder as indicated by QuickDash score of 10% or less   Baseline QuickDash 14%   Status New   Target Date 11/19/16     PT LONG TERM GOAL #2   Title Patient will be independent with home program for pain control, exercises for flexibility and strength to allow transition to self  management once discharged from physical therapy   Baseline limited knowledge of pain control, appropriate exercises and progression without assistance and cuing   Status New   Target Date 11/19/16               Plan - 10/31/16 1436    Clinical Impression Statement Patient demonstrates steady improvement towards goals with improving strength, motor control and functional use right UE with less difficulty.    Rehab Potential Good   Clinical Impairments Affecting Rehab Potential (+)motivated, prior level of function, acute condition   PT Frequency Other (comment)  1-2x/week   PT Duration 6 weeks   PT Treatment/Interventions Manual techniques;Patient/family education;Neuromuscular re-education;Therapeutic exercise;Moist Heat;Ultrasound;Iontophoresis 4mg /ml Dexamethasone;Electrical Stimulation;Cryotherapy   PT Next Visit Plan pain control, neuromuscular re education, progressive exercise   PT Home Exercise Plan posture awareness, scapular retraction with/without resistance, scapular punches and flexion overhead supine lying      Patient will benefit from skilled therapeutic intervention in order to improve the following deficits and impairments:  Decreased strength, Impaired perceived functional ability, Pain, Increased muscle spasms, Decreased range of motion, Impaired UE functional use  Visit Diagnosis: Acute pain of right shoulder  Muscle weakness (generalized)     Problem List There are no active problems to display for this patient.   Jomarie Longs PT 11/01/2016, 9:42 AM  [place] [facility] [address] [place], [address] Phone: [phone]   Fax:  [phone]  Name: Monica Everett MRN: [id number] Date of Birth: 10-21-1950

## 2016-11-05 ENCOUNTER — Ambulatory Visit: Payer: Medicare Other | Attending: Family Medicine | Admitting: Physical Therapy

## 2016-11-05 ENCOUNTER — Encounter: Payer: Self-pay | Admitting: Physical Therapy

## 2016-11-05 DIAGNOSIS — M25511 Pain in right shoulder: Secondary | ICD-10-CM | POA: Diagnosis present

## 2016-11-05 DIAGNOSIS — M6281 Muscle weakness (generalized): Secondary | ICD-10-CM | POA: Diagnosis not present

## 2016-11-05 NOTE — Therapy (Signed)
[place] [facility] [address], [place], [address] Phone: [phone]   Fax:  [phone]  Physical Therapy Treatment  Patient Details  Name: Monica Everett MRN: [id number] Date of Birth: August 23, 1950 Referring Provider: Derinda Late MD  Encounter Date: 11/05/2016      PT End of Session - 11/05/16 1308    Visit Number 7   Number of Visits 12   Date for PT Re-Evaluation 11/19/16   Authorization Type 7   Authorization Time Period 10 G code   PT Start Time 1302   PT Stop Time 1345   PT Time Calculation (min) 43 min   Activity Tolerance Patient tolerated treatment well   Behavior During Therapy [facility] for tasks assessed/performed      Past Medical History:  Diagnosis Date  . Anxiety   . Depression   . DJD (degenerative joint disease)   . Osteopenia   . Seasonal depression ([place])     Past Surgical History:  Procedure Laterality Date  . APPENDECTOMY    . COLONOSCOPY WITH PROPOFOL N/A 06/19/2015   Procedure: COLONOSCOPY WITH PROPOFOL;  Surgeon: Manya Silvas, MD;  Location: [facility] ENDOSCOPY;  Service: Endoscopy;  Laterality: N/A;    There were no vitals filed for this visit.      Subjective Assessment - 11/05/16 1307    Subjective Feels no discomfort at rest and only mild discomfort with movement and certain exercises.    Pertinent History ~ 55month ago patient reports gradual onset of pain in right shoulder for no apparent reason. She has been self managing pain until seen by MD and received an injection with good results. No previous history of shoulder pain.    Limitations Lifting;House hold activities;Other (comment)  placing right arm behind back   Patient Stated Goals improve strength to be able to return to exercises at gym and to know what to do to not re injure right shoulder   Currently in Pain? No/denies      Objective: Palpation: moderate spasms with increased tenderness on palpation right  posterior shoulder latissimus and infraspinatus, teres muscles  Treatment:  Manual therapy: goal: decrease spasms, improve soft tissue elasticity  In conjunction with exercise:  STM performed to right shoulder lats and infraspinatus, teres muscles with compression technique and superficial techniques performed with patient in supine and left side lying; scapular mobilization performed right shoulder with patient side lying left   Therapeutic exercise: patient performed exercises with verbal, tactile cues and demonstration of therapist: Goal: independent with home program, improve QuickDash OMEGA cable machine: required VC, tactile cues to perform with correct shoulder alignment and decrease substitution Seated scapular rows 15# bilateral 15 reps Single arm row 5# x 10 reps each UE Straight arm pull downs standing: to hips 15# x 15 reps Reverse seated chin ups 15# x 15 reps Body blade with small blade chest press x 30 seconds, press down in front x 30 seconds Stretches for triceps, "tree hug" 3 reps x 10 seconds  At wall:  ER bilateral shoulders with towel under each arm: ER palms up x 20 and perpendular  x 15 reps each holding (2) 1# weights  Supine lying: performed with assistance of therapist: Right UE Rhythmic stabilization with right shoulder at 90 degrees flexion 2 x 10 reps flexion/extension moderate resistance Rhythmic stabilization for rotations with shoulder in neutral and arm at side 2 x 10 reps moderate resistance given  Modalities: Electrical stimulation: 15 min.Russian stim. 10/10  cycle applied (4) electrodes to right shoulder: (2)posterior aspect over external rotators and (2)along scapula and lower trapezius muscle: goal muscle re education   Patient response to treatment: Improved soft tissue elasticity with decreased spasms to mild and much less tender to palpation, prior to exercise. Improved technique, positioning with exercises with minimal to moderate cuing and  demonstration. Mild fatigue noted with ER exercises at wall. Improved muscle contraction and muscle re education following estim.           PT Education - 11/05/16 1308    Education provided Yes   Education Details exercise instruction for correct technique   Person(s) Educated Patient   Methods Explanation;Demonstration;Verbal cues   Comprehension Verbalized understanding;Returned demonstration;Verbal cues required             PT Long Term Goals - 10/08/16 1200      PT LONG TERM GOAL #1   Title Patient will demonstrate improved function and decreased pain in shoulder as indicated by QuickDash score of 10% or less   Baseline QuickDash 14%   Status New   Target Date 11/19/16     PT LONG TERM GOAL #2   Title Patient will be independent with home program for pain control, exercises for flexibility and strength to allow transition to self management once discharged from physical therapy   Baseline limited knowledge of pain control, appropriate exercises and progression without assistance and cuing   Status New   Target Date 11/19/16               Plan - 11/05/16 1343    Clinical Impression Statement Patient continues to improve motor control and strength right shoulder with current treatment with improvement noted in functional activities at home. She continues with decreased strength in right shoulder and spasms and will benefit from additional physicla therapy intervention to achieve goals.    Rehab Potential Good   Clinical Impairments Affecting Rehab Potential (+)motivated, prior level of function, acute condition   PT Frequency Other (comment)  1-2x/week   PT Duration 6 weeks   PT Treatment/Interventions Manual techniques;Patient/family education;Neuromuscular re-education;Therapeutic exercise;Moist Heat;Ultrasound;Iontophoresis 4mg /ml Dexamethasone;Electrical Stimulation;Cryotherapy   PT Next Visit Plan pain control, neuromuscular re education, progressive  exercise   PT Home Exercise Plan posture awareness, scapular retraction with/without resistance, scapular punches and flexion overhead supine lying      Patient will benefit from skilled therapeutic intervention in order to improve the following deficits and impairments:  Decreased strength, Impaired perceived functional ability, Pain, Increased muscle spasms, Decreased range of motion, Impaired UE functional use  Visit Diagnosis: Muscle weakness (generalized)  Acute pain of right shoulder     Problem List There are no active problems to display for this patient.   Jomarie Longs PT 11/06/2016, 7:36 PM  [place] [facility] [address], [place], [address] Phone: [phone]   Fax:  [phone]  Name: BLOSSOM CRUME MRN: [id number] Date of Birth: Jul 22, 1950

## 2016-11-07 ENCOUNTER — Ambulatory Visit: Payer: Medicare Other | Admitting: Physical Therapy

## 2016-11-07 ENCOUNTER — Encounter: Payer: Self-pay | Admitting: Physical Therapy

## 2016-11-07 DIAGNOSIS — M6281 Muscle weakness (generalized): Secondary | ICD-10-CM

## 2016-11-07 DIAGNOSIS — M25511 Pain in right shoulder: Secondary | ICD-10-CM

## 2016-11-07 NOTE — Therapy (Signed)
[facility] [address], [place], [address] Phone: [phone]   Fax:  [phone]  Physical Therapy Treatment  Patient Details  Name: Monica Everett MRN: [id number] Date of Birth: [birth year] Referring Provider: Derinda Late MD  Encounter Date: 11/07/2016      PT End of Session - 11/07/16 1306    Visit Number 8   Number of Visits 12   Date for PT Re-Evaluation 11/19/16   Authorization Type 8   Authorization Time Period 10 G code   PT Start Time 6734   PT Stop Time 1345   PT Time Calculation (min) 40 min   Activity Tolerance Patient tolerated treatment well   Behavior During Therapy [facility] for tasks assessed/performed      Past Medical History:  Diagnosis Date  . Anxiety   . Depression   . DJD (degenerative joint disease)   . Osteopenia   . Seasonal depression ([place])     Past Surgical History:  Procedure Laterality Date  . APPENDECTOMY    . COLONOSCOPY WITH PROPOFOL N/A 06/19/2015   Procedure: COLONOSCOPY WITH PROPOFOL;  Surgeon: Manya Silvas, MD;  Location: [facility] ENDOSCOPY;  Service: Endoscopy;  Laterality: N/A;    There were no vitals filed for this visit.      Subjective Assessment - 11/07/16 1305    Subjective Feels no discomfort at rest and only mild discomfort with movement and certain exercises.    Pertinent History ~ 45month ago patient reports gradual onset of pain in right shoulder for no apparent reason. She has been self managing pain until seen by MD and received an injection with good results. No previous history of shoulder pain.    Limitations Lifting;House hold activities;Other (comment)  placing right arm behind back   Patient Stated Goals improve strength to be able to return to exercises at gym and to know what to do to not re injure right shoulder   Currently in Pain? No/denies         Objective: Strength: improving periscapular muscle strength and motor control,  improving right shoulder/UE strength as demonstrated during exercises  Treatment:   Therapeutic exercise: patient performed exercises with verbal, tactile cues and demonstration of therapist: Goal: independent with home program, improve QuickDash OMEGA cable machine: required VC, tactile cues to perform with correct shoulder alignment and decrease substitution Seated scapular rows 15# bilateral 15 reps Single arm row 5# x 10 reps each UE Straight arm pull downs standing: to hips 15# x 15 reps Reverse seated chin ups 15# x 15 reps Body blade with small blade chest press x 30 seconds, press down in front x 30 seconds Stretches for triceps, "tree hug" 3 reps x 10 seconds  Supine lying: performed with assistance of therapist: Right UE Rhythmic stabilization with right shoulder at 90 degrees flexion 2 x 10 reps flexion/extension moderate resistance Rhythmic stabilization for rotations with shoulder in neutral and arm at side 2 x 10 reps moderate resistance given  Modalities: Electrical stimulation: 20 min.Russian stim. 10/10 cycle applied (4) electrodes to right shoulder: (2)posterior aspect over external rotators and (2)along scapula and lower trapezius muscle, intensity to contraciton: goal muscle re education   Patient response to treatment: patient demonstrated improved technique with exercises with minimal VC for correct alignment. Improved motor control with repetition and cuing       PT Education - 11/07/16 1306    Education provided Yes   Education Details exercise instruction  Person(s) Educated Patient   Methods Explanation   Comprehension Verbalized understanding             PT Long Term Goals - 10/08/16 1200      PT LONG TERM GOAL #1   Title Patient will demonstrate improved function and decreased pain in shoulder as indicated by QuickDash score of 10% or less   Baseline QuickDash 14%   Status New   Target Date 11/19/16     PT LONG TERM GOAL #2    Title Patient will be independent with home program for pain control, exercises for flexibility and strength to allow transition to self management once discharged from physical therapy   Baseline limited knowledge of pain control, appropriate exercises and progression without assistance and cuing   Status New   Target Date 11/19/16               Plan - 11/07/16 1307    Clinical Impression Statement Patient continues to improve motor control and strength right shoulder.. She continues with decreased strength in right shoulder and requires minimal to moderate cuing for exercise technique, posture. She  will benefit from addiitional physical therapy intervention to achieve goals.    Rehab Potential Good   Clinical Impairments Affecting Rehab Potential (+)motivated, prior level of function, acute condition   PT Frequency Other (comment)  1-2x/week   PT Duration 6 weeks   PT Treatment/Interventions Manual techniques;Patient/family education;Neuromuscular re-education;Therapeutic exercise;Moist Heat;Ultrasound;Iontophoresis 4mg /ml Dexamethasone;Electrical Stimulation;Cryotherapy   PT Next Visit Plan pain control, neuromuscular re education, progressive exercise   PT Home Exercise Plan posture awareness, scapular retraction with/without resistance, scapular punches and flexion overhead supine lying      Patient will benefit from skilled therapeutic intervention in order to improve the following deficits and impairments:  Decreased strength, Impaired perceived functional ability, Pain, Increased muscle spasms, Decreased range of motion, Impaired UE functional use  Visit Diagnosis: Muscle weakness (generalized)  Acute pain of right shoulder     Problem List There are no active problems to display for this patient.   Jomarie Longs PT 11/08/2016, 6:15 PM  [facility] [address] [place], [address] Phone:  [phone]   Fax:  [phone]  Name: Monica Everett MRN: [id number] Date of Birth: [birth year]

## 2016-11-12 ENCOUNTER — Ambulatory Visit: Payer: Medicare Other | Admitting: Physical Therapy

## 2016-11-12 ENCOUNTER — Encounter: Payer: Self-pay | Admitting: Physical Therapy

## 2016-11-12 DIAGNOSIS — M6281 Muscle weakness (generalized): Secondary | ICD-10-CM | POA: Diagnosis not present

## 2016-11-12 DIAGNOSIS — M25511 Pain in right shoulder: Secondary | ICD-10-CM

## 2016-11-12 NOTE — Therapy (Signed)
[place] [facility] [address], [place], [address] Phone: [phone]   Fax:  [phone]  Physical Therapy Treatment  Patient Details  Name: Monica Everett MRN: [id number] Date of Birth: 1951-02-27 Referring Provider: Derinda Late MD  Encounter Date: 11/12/2016      PT End of Session - 11/12/16 1310    Visit Number 9   Number of Visits 12   Date for PT Re-Evaluation 11/19/16   Authorization Type 9   Authorization Time Period 10 G code   PT Start Time 1304   PT Stop Time 1345   PT Time Calculation (min) 41 min   Activity Tolerance Patient tolerated treatment well   Behavior During Therapy [facility] for tasks assessed/performed      Past Medical History:  Diagnosis Date  . Anxiety   . Depression   . DJD (degenerative joint disease)   . Osteopenia   . Seasonal depression ([place])     Past Surgical History:  Procedure Laterality Date  . APPENDECTOMY    . COLONOSCOPY WITH PROPOFOL N/A 06/19/2015   Procedure: COLONOSCOPY WITH PROPOFOL;  Surgeon: Manya Silvas, MD;  Location: [facility] ENDOSCOPY;  Service: Endoscopy;  Laterality: N/A;    There were no vitals filed for this visit.      Subjective Assessment - 11/12/16 1305    Subjective Feels a little discomfort in right following gym exercises   Pertinent History ~ 68month ago patient reports gradual onset of pain in right shoulder for no apparent reason. She has been self managing pain until seen by MD and received an injection with good results. No previous history of shoulder pain.    Limitations Lifting;House hold activities;Other (comment)  placing right arm behind back   Patient Stated Goals improve strength to be able to return to exercises at gym and to know what to do to not re injure right shoulder   Currently in Pain? No/denies       Objective: Strength: improving periscapular muscle strength and motor control, improving right shoulder/UE  strength as demonstrated during exercises  Treatment:   Therapeutic exercise: patient performed exercises with verbal, tactile cues and demonstration of therapist: Goal: independent with home program, improve QuickDash OMEGA cable machine: required VC, tactile cues to perform with correct shoulder alignment and decrease substitution Seated scapular rows 15# bilateral 15 reps Single arm row 10# x 10 reps each UE Straight arm pull downs standing: to hips 15# x 15 reps, 20# x 8 reps Reverse seated chin ups 15# x 15 reps Body blade with small blade chest press x 30 seconds, press down in front x 30 seconds Stretches for triceps, "tree hug" 3 reps x 10 seconds   Modalities: Electrical stimulation: 20 min.Russian stim. 10/10 cycle applied (4) electrodes to right shoulder: (2)posterior aspect over external rotators and (2)along scapula and lower trapezius muscle, intensity to contraciton: goal muscle re education   Patient response to treatment: patient required minimal to no cuing to perform exercises with correct technique and alignment. Minimal cuing for modifying exercise to avoid increased strain on right shoulder.        PT Education - 11/12/16 1308    Education provided Yes   Education Details exercise instruction   Person(s) Educated Patient   Methods Explanation   Comprehension Verbalized understanding             PT Long Term Goals - 10/08/16 1200      PT LONG TERM GOAL #  1   Title Patient will demonstrate improved function and decreased pain in shoulder as indicated by QuickDash score of 10% or less   Baseline QuickDash 14%   Status New   Target Date 11/19/16     PT LONG TERM GOAL #2   Title Patient will be independent with home program for pain control, exercises for flexibility and strength to allow transition to self management once discharged from physical therapy   Baseline limited knowledge of pain control, appropriate exercises and progression  without assistance and cuing   Status New   Target Date 11/19/16               Plan - 11/12/16 1337    Clinical Impression Statement Patient is progressing well with exercises and progressing steadily towards goals. she requires minimal to no cuing for modifying exercises and to perform with correct posture, alignment and technique.    Rehab Potential Good   Clinical Impairments Affecting Rehab Potential (+)motivated, prior level of function, acute condition   PT Frequency Other (comment)  1-2x/week   PT Duration 6 weeks   PT Treatment/Interventions Manual techniques;Patient/family education;Neuromuscular re-education;Therapeutic exercise;Moist Heat;Ultrasound;Iontophoresis 4mg /ml Dexamethasone;Electrical Stimulation;Cryotherapy   PT Next Visit Plan pain control, neuromuscular re education, progressive exercise   PT Home Exercise Plan posture awareness, scapular retraction with/without resistance, scapular punches and flexion overhead supine lying      Patient will benefit from skilled therapeutic intervention in order to improve the following deficits and impairments:  Decreased strength, Impaired perceived functional ability, Pain, Increased muscle spasms, Decreased range of motion, Impaired UE functional use  Visit Diagnosis: Muscle weakness (generalized)  Acute pain of right shoulder     Problem List There are no active problems to display for this patient.   Jomarie Longs PT 11/13/2016, 3:11 PM  [place] [facility] [address], [place], [address] Phone: [phone]   Fax:  [phone]  Name: ZITLALY MALSON MRN: [id number] Date of Birth: April 05, 1950

## 2016-11-14 ENCOUNTER — Encounter: Payer: Self-pay | Admitting: Physical Therapy

## 2016-11-14 ENCOUNTER — Ambulatory Visit: Payer: Medicare Other | Admitting: Physical Therapy

## 2016-11-14 DIAGNOSIS — M6281 Muscle weakness (generalized): Secondary | ICD-10-CM

## 2016-11-14 DIAGNOSIS — M25511 Pain in right shoulder: Secondary | ICD-10-CM

## 2016-11-14 NOTE — Therapy (Signed)
[place] [facility] [address] [place], [address] Phone: [phone]   Fax:  [phone]  Physical Therapy Treatment  Patient Details  Name: Monica Everett MRN: [id number] Date of Birth: 05-15-1950 Referring Provider: Derinda Late MD  Encounter Date: 11/14/2016      PT End of Session - 11/14/16 1413    Visit Number 10   Number of Visits 12   Date for PT Re-Evaluation 11/19/16   Authorization Type 10   Authorization Time Period 10 G code   PT Start Time 1330   PT Stop Time 1415   PT Time Calculation (min) 45 min   Activity Tolerance Patient tolerated treatment well   Behavior During Therapy [facility] for tasks assessed/performed      Past Medical History:  Diagnosis Date  . Anxiety   . Depression   . DJD (degenerative joint disease)   . Osteopenia   . Seasonal depression ([place])     Past Surgical History:  Procedure Laterality Date  . APPENDECTOMY    . COLONOSCOPY WITH PROPOFOL N/A 06/19/2015   Procedure: COLONOSCOPY WITH PROPOFOL;  Surgeon: Manya Silvas, MD;  Location: [facility] ENDOSCOPY;  Service: Endoscopy;  Laterality: N/A;    There were no vitals filed for this visit.      Subjective Assessment - 11/14/16 1337    Subjective Patient reports she is having soreness in shoulders and back due to exercise class. She would like to review her home exercises for correct technique and alignment.    Pertinent History ~ 57month ago patient reports gradual onset of pain in right shoulder for no apparent reason. She has been self managing pain until seen by MD and received an injection with good results. No previous history of shoulder pain.    Limitations Lifting;House hold activities;Other (comment)  placing right arm behind back   Patient Stated Goals improve strength to be able to return to exercises at gym and to know what to do to not re injure right shoulder   Currently in Pain? Yes   Pain Score 3    Pain Location Shoulder   Pain Orientation Right   Pain Descriptors / Indicators Sore   Pain Type Acute pain   Pain Onset More than a month ago   Pain Frequency Intermittent        Objective: Strength: improving periscapular muscle strength and motor control, improving right shoulder/UE strength as demonstrated during exercises  Treatment:   Therapeutic exercise: patient performed exercises with verbal, tactile cues and demonstration of therapist: Goal: independent with home program, improve QuickDash Scapular retraction at door x 15 Wall push ups x 10 Lower trapezius lift off wall 10 single and 10 bilateral Serratus punches supine x 15 Side lying ER right shoulder with towel under arm 2# weight x 10 reps Side lying elevation/depression and protraction/retraction of scapula x 10 reps with hold last rep 10 seconds Stretches for triceps, "tree hug" 3 reps x 10 seconds Prone lying right shoulder extension 2# x 10 Prone row right 3# Prone horizontal abduction no weight x 10 with controlled motion lowering   Modalities: Electrical stimulation: 57min.Russian stim. 10/10 cycle applied (4) electrodes to right shoulder: (2)posterior aspect over external rotators and (2)along scapula and lower trapezius muscle, intensity to contraciton: goal muscle re education   Patient response to treatment: patient required minimal to no cuing to perform exercises with correct technique and alignment. Moderate cuing for new exercises prone lying. Improved posture and  control with repetition.          PT Education - 12-10-16 1413    Education provided Yes   Education Details home exercises re assessed and added prone row, horizontal abduction, shoulder extension   Person(s) Educated Patient   Methods Explanation;Verbal cues;Handout;Demonstration   Comprehension Verbalized understanding;Returned demonstration;Verbal cues required             PT Long Term Goals - 12/10/2016 1432       PT LONG TERM GOAL #1   Title Patient will demonstrate improved function and decreased pain in shoulder as indicated by QuickDash score of 10% or less   Baseline QuickDash 14%   Status On-going   Target Date 11/19/16     PT LONG TERM GOAL #2   Title Patient will be independent with home program for pain control, exercises for flexibility and strength to allow transition to self management once discharged from physical therapy   Baseline limited knowledge of pain control, appropriate exercises and progression without assistance and cuing   Status On-going   Target Date 11/19/16               Plan - 12-10-2016 1414    Clinical Impression Statement patient is progressing well with exercises and goals. She continues with decreased endurance and strength and reports much improvement since beginning physical therapy. She requires minimal cuing for exercises and plan for discharge following next session.    Rehab Potential Good   Clinical Impairments Affecting Rehab Potential (+)motivated, prior level of function, acute condition   PT Frequency Other (comment)  1-2x/week   PT Duration 6 weeks   PT Treatment/Interventions Manual techniques;Patient/family education;Neuromuscular re-education;Therapeutic exercise;Moist Heat;Ultrasound;Iontophoresis 4mg /ml Dexamethasone;Electrical Stimulation;Cryotherapy   PT Next Visit Plan pain control, neuromuscular re education, progressive exercise; re assess QuickDash, home exercises given    PT Home Exercise Plan posture awareness, scapular retraction with/without resistance, scapular punches and flexion overhead supine lying; prone shoulder exercises      Patient will benefit from skilled therapeutic intervention in order to improve the following deficits and impairments:  Decreased strength, Impaired perceived functional ability, Pain, Increased muscle spasms, Decreased range of motion, Impaired UE functional use  Visit Diagnosis: Muscle weakness  (generalized)  Acute pain of right shoulder       G-Codes - 12-10-2016 1419    Functional Assessment Tool Used (Outpatient Only) quickDash, ROM, strength, pain, clinical judgment   Functional Limitation Carrying, moving and handling objects   Carrying, Moving and Handling Objects Current Status (I3382) At least 20 percent but less than 40 percent impaired, limited or restricted   Carrying, Moving and Handling Objects Goal Status (N0539) At least 1 percent but less than 20 percent impaired, limited or restricted      Problem List There are no active problems to display for this patient.   Jomarie Longs PT 11/15/2016, 10:03 AM  [place] [facility] [address] [place], [address] Phone: [phone]   Fax:  [phone]  Name: Monica Everett MRN: [id number] Date of Birth: [birth year]

## 2016-11-19 ENCOUNTER — Ambulatory Visit: Payer: Medicare Other | Admitting: Physical Therapy

## 2016-11-19 ENCOUNTER — Encounter: Payer: Self-pay | Admitting: Physical Therapy

## 2016-11-19 DIAGNOSIS — M6281 Muscle weakness (generalized): Secondary | ICD-10-CM | POA: Diagnosis not present

## 2016-11-19 DIAGNOSIS — M25511 Pain in right shoulder: Secondary | ICD-10-CM

## 2016-11-19 NOTE — Therapy (Signed)
[place] [facility] [address], [place], [address] Phone: [phone]   Fax:  [phone]  Physical Therapy Treatment/Discharge Summary  Patient Details  Name: ROSE HEGNER MRN: [id number] Date of Birth: [birth year] Referring Provider: Derinda Late MD  Encounter Date: 11/19/2016   Patient began physical therapy on 10/08/2016 and has attended 11 sessions through. She has achieved goals and is independent in home program for continued self management of pain/symptoms and exercises as instructed. Plan discharge from physical therapy at this time.        PT End of Session - 11/19/16 1310    Visit Number 11   Number of Visits 12   Date for PT Re-Evaluation 11/19/16   Authorization Type 11   Authorization Time Period 20 G code   PT Start Time 1303   PT Stop Time 1343   PT Time Calculation (min) 40 min   Activity Tolerance Patient tolerated treatment well   Behavior During Therapy WFL for tasks assessed/performed      Past Medical History:  Diagnosis Date  . Anxiety   . Depression   . DJD (degenerative joint disease)   . Osteopenia   . Seasonal depression ([place])     Past Surgical History:  Procedure Laterality Date  . APPENDECTOMY    . COLONOSCOPY WITH PROPOFOL N/A 06/19/2015   Procedure: COLONOSCOPY WITH PROPOFOL;  Surgeon: Manya Silvas, MD;  Location: [facility] ENDOSCOPY;  Service: Endoscopy;  Laterality: N/A;    There were no vitals filed for this visit.      Subjective Assessment - 11/19/16 1306    Subjective Patient reports she is ready for discharge from physical therapy and has improved her knowledge of exercises and posture for daily activities.    Pertinent History ~ 23month ago patient reports gradual onset of pain in right shoulder for no apparent reason. She has been self managing pain until seen by MD and received an injection with good results. No previous history of shoulder pain.    Limitations Lifting;House hold activities;Other (comment);Standing  placing right arm behind back   Patient Stated Goals improve strength to be able to return to exercises at gym and to know what to do to not re injure right shoulder   Currently in Pain? No/denies      Objective: Strength: decreased scapular muscle strength right shoulder as compared to left Outcome measure: quickDash 10%   Treatment:   Therapeutic exercise: patient performed exercises with verbal, tactile cues and demonstration of therapist: Goal: independent with home program, improve QuickDash Scapular retraction at door x 15 Wall push ups x 10 ER with yellow resistive band at wall with scapular retraction 10 reps Lower trapezius lift off wall 10 single and 10 bilateral Side lying elevation/depression and protraction/retraction of scapula x 10 reps with hold last rep 10 seconds Prone lying right shoulder extension 2# x 10 Prone row right 2# x 15 reps Prone horizontal abduction no weight x 10 with controlled motion lowering  OMEGA; Sitting scapular retraction 15# x 15 reps Standing straight arm pull downs 15# x 15 reps Reverse chin ups 20# x 15 reps Single arm row 10# x 10 reps with more difficulty with right UE   Patient response to treatment: patient required minimal to no cuing to perform exercises with correct technique and alignment.         PT Education - 11/19/16 1308    Education provided Yes   Education Details re assessed  exercises for shoulder   Person(s) Educated Patient   Methods Explanation;Demonstration;Verbal cues   Comprehension Verbalized understanding;Returned demonstration;Verbal cues required             PT Long Term Goals - November 25, 2016 1752      PT LONG TERM GOAL #1   Title Patient will demonstrate improved function and decreased pain in shoulder as indicated by QuickDash score of 10% or less   Baseline QuickDash 14%; 11/25/16 10%   Status Achieved     PT LONG TERM  GOAL #2   Title Patient will be independent with home program for pain control, exercises for flexibility and strength to allow transition to self management once discharged from physical therapy   Baseline limited knowledge of pain control, appropriate exercises and progression without assistance and cuing   Status Achieved               Plan - November 25, 2016 1314    Clinical Impression Statement Patient has achieved goals for independent home exercises and improved function with mild limitations. She demonstrates good knowledge of appropriate exercises, technique and able to modify appropriately to adjust for symptoms. She is ready for discharge from physical therapy at this time.    Rehab Potential Good   Clinical Impairments Affecting Rehab Potential (+)motivated, prior level of function, acute condition   PT Frequency Other (comment)  1-2x/week   PT Duration 6 weeks   PT Treatment/Interventions Manual techniques;Patient/family education;Neuromuscular re-education;Therapeutic exercise;Moist Heat;Ultrasound;Iontophoresis 4mg /ml Dexamethasone;Electrical Stimulation;Cryotherapy   PT Next Visit Plan Discharge    PT Home Exercise Plan posture awareness, scapular retraction with/without resistance, scapular punches and flexion overhead supine lying; prone shoulder exercises      Patient will benefit from skilled therapeutic intervention in order to improve the following deficits and impairments:  Decreased strength, Impaired perceived functional ability, Pain, Increased muscle spasms, Decreased range of motion, Impaired UE functional use  Visit Diagnosis: Muscle weakness (generalized)  Acute pain of right shoulder       G-Codes - November 25, 2016 1753    Functional Assessment Tool Used (Outpatient Only) quickDash, ROM, strength, pain, clinical judgment   Functional Limitation Carrying, moving and handling objects   Carrying, Moving and Handling Objects Goal Status (W2993) At least 1 percent  but less than 20 percent impaired, limited or restricted   Carrying, Moving and Handling Objects Discharge Status [phone]) At least 1 percent but less than 20 percent impaired, limited or restricted      Problem List There are no active problems to display for this patient.   Jomarie Longs PT 25-Nov-2016, 6:30 PM  [place] [facility] [address] [place], [address] Phone: [phone]   Fax:  [phone]  Name: CARLETA WOODROW MRN: [id number] Date of Birth: 1950-06-24

## 2016-11-21 ENCOUNTER — Ambulatory Visit: Payer: Medicare Other | Admitting: Physical Therapy

## 2016-11-25 ENCOUNTER — Ambulatory Visit: Payer: Medicare Other | Admitting: Physical Therapy

## 2016-11-28 ENCOUNTER — Encounter: Payer: Medicare Other | Admitting: Physical Therapy

## 2017-06-30 ENCOUNTER — Other Ambulatory Visit: Payer: Self-pay | Admitting: Family Medicine

## 2017-06-30 DIAGNOSIS — Z1231 Encounter for screening mammogram for malignant neoplasm of breast: Secondary | ICD-10-CM

## 2017-08-07 ENCOUNTER — Ambulatory Visit
Admission: RE | Admit: 2017-08-07 | Discharge: 2017-08-07 | Disposition: A | Discharge status: Home / Self Care | Payer: Medicare Other | Source: Ambulatory Visit | Attending: Family Medicine | Admitting: Family Medicine

## 2017-08-07 DIAGNOSIS — Z1231 Encounter for screening mammogram for malignant neoplasm of breast: Secondary | ICD-10-CM | POA: Insufficient documentation

## 2018-07-15 ENCOUNTER — Other Ambulatory Visit: Payer: Self-pay | Admitting: Family Medicine

## 2018-07-15 DIAGNOSIS — Z1231 Encounter for screening mammogram for malignant neoplasm of breast: Secondary | ICD-10-CM

## 2018-08-14 ENCOUNTER — Ambulatory Visit
Admission: RE | Admit: 2018-08-14 | Discharge: 2018-08-14 | Disposition: A | Discharge status: Home / Self Care | Payer: Medicare Other | Source: Ambulatory Visit | Attending: Family Medicine | Admitting: Family Medicine

## 2018-08-14 ENCOUNTER — Other Ambulatory Visit: Payer: Self-pay

## 2018-08-14 DIAGNOSIS — Z1231 Encounter for screening mammogram for malignant neoplasm of breast: Secondary | ICD-10-CM

## 2019-03-29 ENCOUNTER — Ambulatory Visit: Payer: Medicare PPO | Attending: Internal Medicine

## 2019-03-29 DIAGNOSIS — Z23 Encounter for immunization: Secondary | ICD-10-CM | POA: Insufficient documentation

## 2019-03-29 NOTE — Progress Notes (Signed)
   Covid-19 Vaccination Clinic  Name:  Monica Everett    MRN: [id number] DOB: Jan 05, 1951  03/29/2019  [name] was observed post Covid-19 immunization for 15 minutes without incidence. She was provided with Vaccine Information Sheet and instruction to access the V-Safe system.   [name] was instructed to call 911 with any severe reactions post vaccine: Marland Kitchen Difficulty breathing  . Swelling of your face and throat  . A fast heartbeat  . A bad rash all over your body  . Dizziness and weakness    Immunizations Administered    Name Date Dose VIS Date Route   Pfizer COVID-19 Vaccine 03/29/2019  1:10 PM 0.3 mL 02/12/2019 Intramuscular   Manufacturer: [place]   Lot: [id number]   [place]: [id number]

## 2019-04-19 ENCOUNTER — Ambulatory Visit: Payer: Medicare PPO | Attending: Internal Medicine

## 2019-04-19 DIAGNOSIS — Z23 Encounter for immunization: Secondary | ICD-10-CM | POA: Insufficient documentation

## 2019-04-19 NOTE — Progress Notes (Signed)
   Covid-19 Vaccination Clinic  Name:  Monica Everett    MRN: [id number] DOB: 1950/12/19  04/19/2019  Ms. Willing was observed post Covid-19 immunization for 15 minutes without incidence. She was provided with Vaccine Information Sheet and instruction to access the V-Safe system.   [name] was instructed to call 911 with any severe reactions post vaccine: Marland Kitchen Difficulty breathing  . Swelling of your face and throat  . A fast heartbeat  . A bad rash all over your body  . Dizziness and weakness    Immunizations Administered    Name Date Dose VIS Date Route   Pfizer COVID-19 Vaccine 04/19/2019 12:12 PM 0.3 mL 02/12/2019 Intramuscular   Manufacturer: [place]   Lot: [id number]   [place]: [id number]

## 2019-08-16 ENCOUNTER — Other Ambulatory Visit: Payer: Self-pay | Admitting: Family Medicine

## 2019-08-16 DIAGNOSIS — Z1231 Encounter for screening mammogram for malignant neoplasm of breast: Secondary | ICD-10-CM

## 2019-08-25 ENCOUNTER — Ambulatory Visit
Admission: RE | Admit: 2019-08-25 | Discharge: 2019-08-25 | Disposition: A | Discharge status: Home / Self Care | Payer: Medicare PPO | Source: Ambulatory Visit | Attending: Family Medicine | Admitting: Family Medicine

## 2019-08-25 DIAGNOSIS — Z1231 Encounter for screening mammogram for malignant neoplasm of breast: Secondary | ICD-10-CM | POA: Diagnosis present

## 2020-07-11 ENCOUNTER — Other Ambulatory Visit: Payer: Self-pay | Admitting: Family Medicine

## 2020-07-11 DIAGNOSIS — Z1231 Encounter for screening mammogram for malignant neoplasm of breast: Secondary | ICD-10-CM

## 2020-08-25 ENCOUNTER — Ambulatory Visit
Admission: RE | Admit: 2020-08-25 | Discharge: 2020-08-25 | Disposition: A | Discharge status: Home / Self Care | Payer: Medicare PPO | Source: Ambulatory Visit | Attending: Family Medicine | Admitting: Family Medicine

## 2020-08-25 ENCOUNTER — Other Ambulatory Visit: Payer: Self-pay

## 2020-08-25 DIAGNOSIS — Z1231 Encounter for screening mammogram for malignant neoplasm of breast: Secondary | ICD-10-CM | POA: Diagnosis not present

## 2021-08-07 ENCOUNTER — Other Ambulatory Visit: Payer: Self-pay | Admitting: Family Medicine

## 2021-08-07 DIAGNOSIS — Z1231 Encounter for screening mammogram for malignant neoplasm of breast: Secondary | ICD-10-CM

## 2021-09-10 ENCOUNTER — Ambulatory Visit
Admission: RE | Admit: 2021-09-10 | Discharge: 2021-09-10 | Disposition: A | Discharge status: Home / Self Care | Payer: Medicare PPO | Source: Ambulatory Visit | Attending: Family Medicine | Admitting: Family Medicine

## 2021-09-10 DIAGNOSIS — Z1231 Encounter for screening mammogram for malignant neoplasm of breast: Secondary | ICD-10-CM | POA: Insufficient documentation

## 2022-07-14 IMAGING — MG MM DIGITAL SCREENING BILAT W/ TOMO AND CAD
6 of 10 series · 6 of 30 positions shown · non-contrast
Comparison: Previous exam(s).

CLINICAL DATA: Screening.

EXAM:
DIGITAL SCREENING BILATERAL MAMMOGRAM WITH TOMOSYNTHESIS AND CAD
TECHNIQUE: Bilateral screening digital craniocaudal and mediolateral oblique
mammograms were obtained. Bilateral screening digital breast
tomosynthesis was performed. The images were evaluated with
computer-aided detection.

[L MLO synth-2D (1 of 2)]
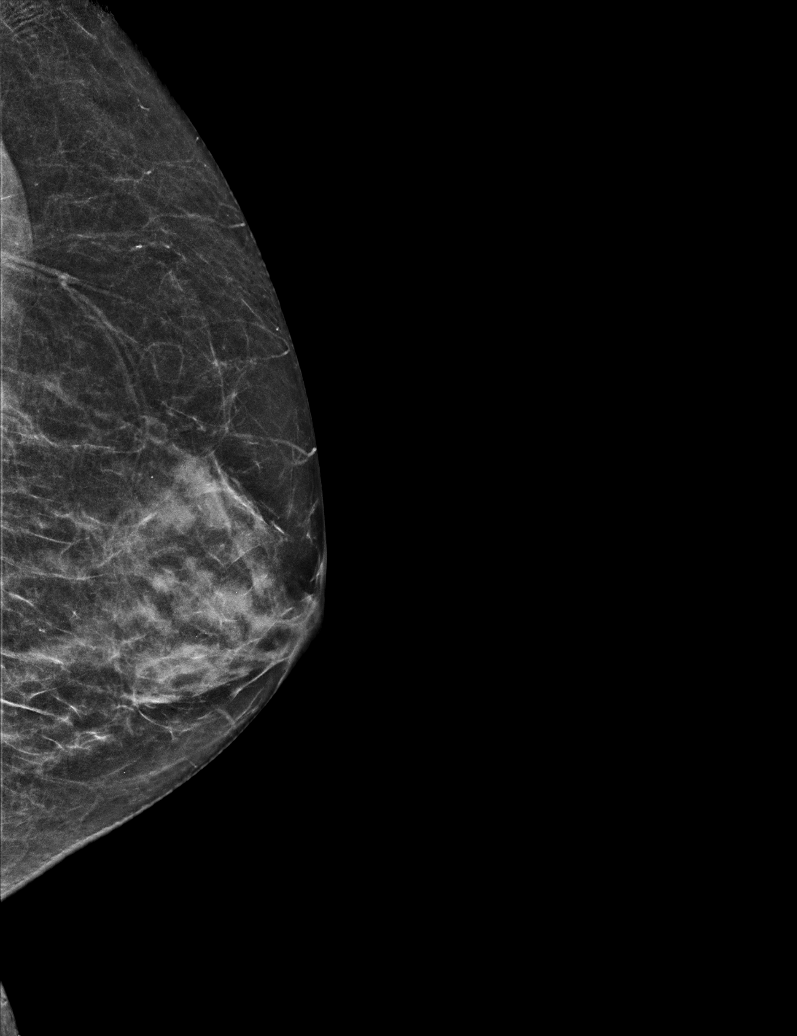

[R MLO synth-2D]
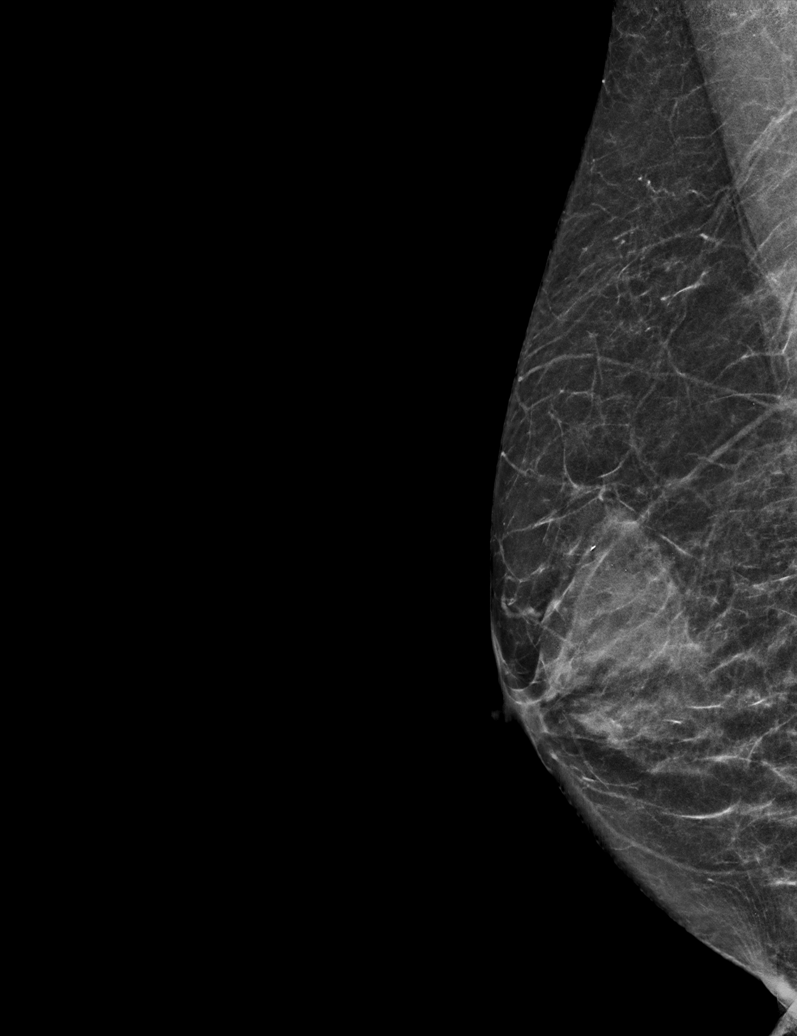

[L MLO synth-2D (2 of 2)]
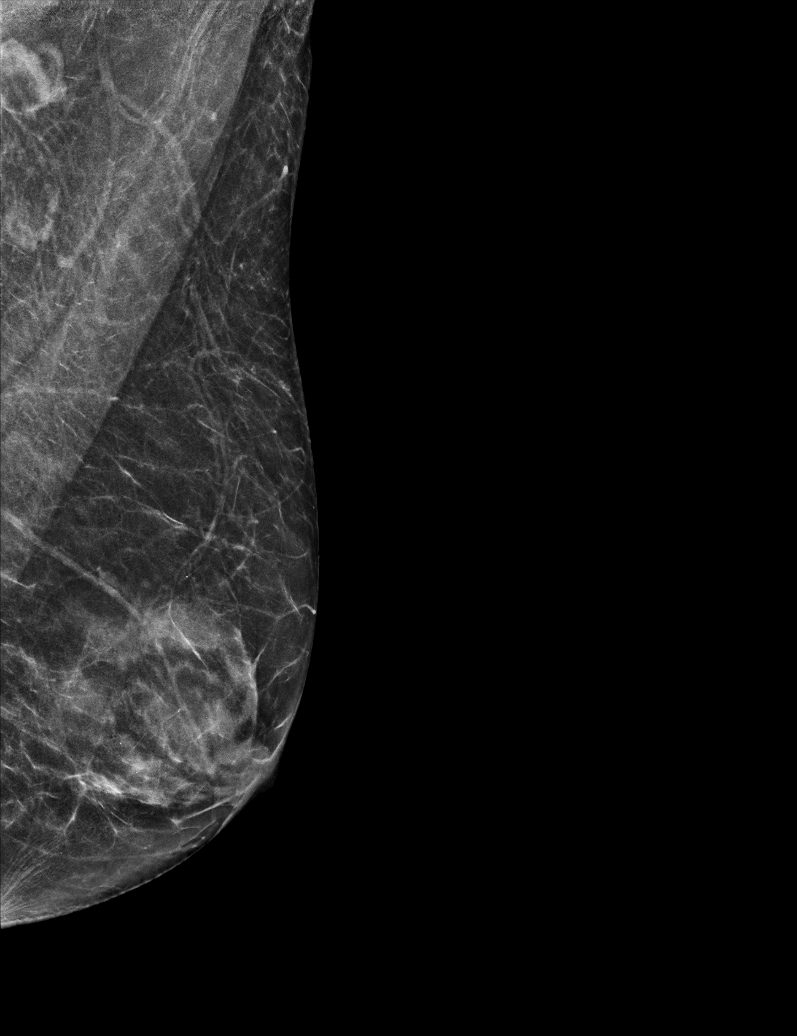

[L CC synth-2D]
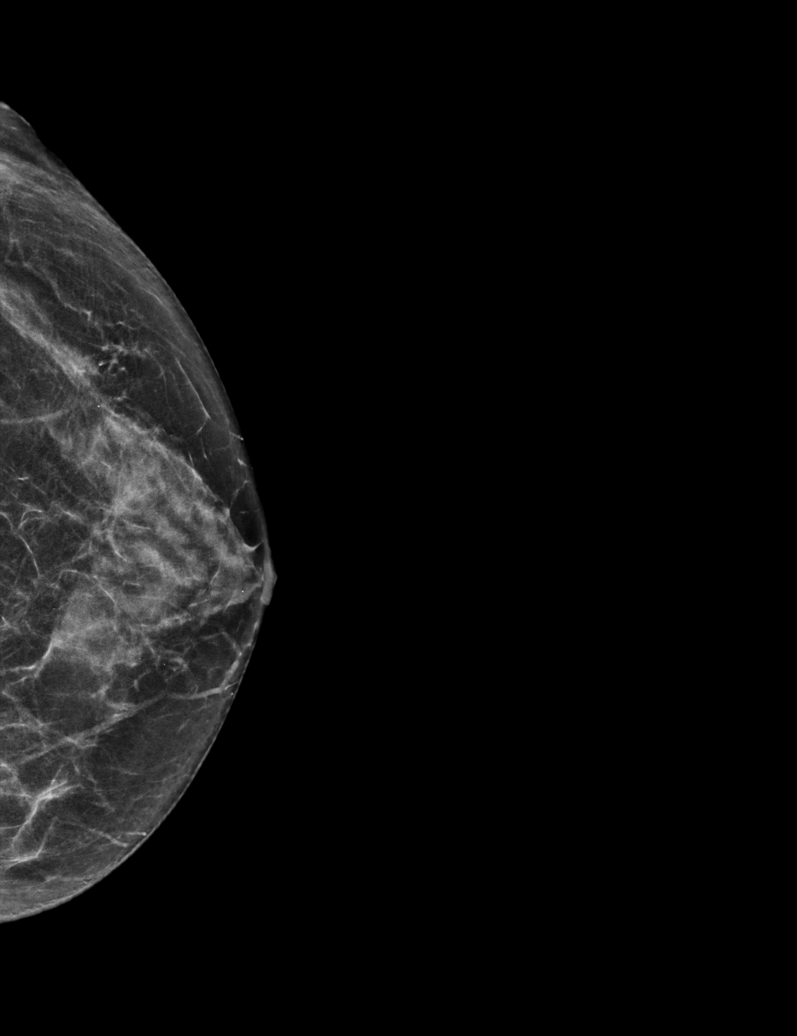

[R CC synth-2D]
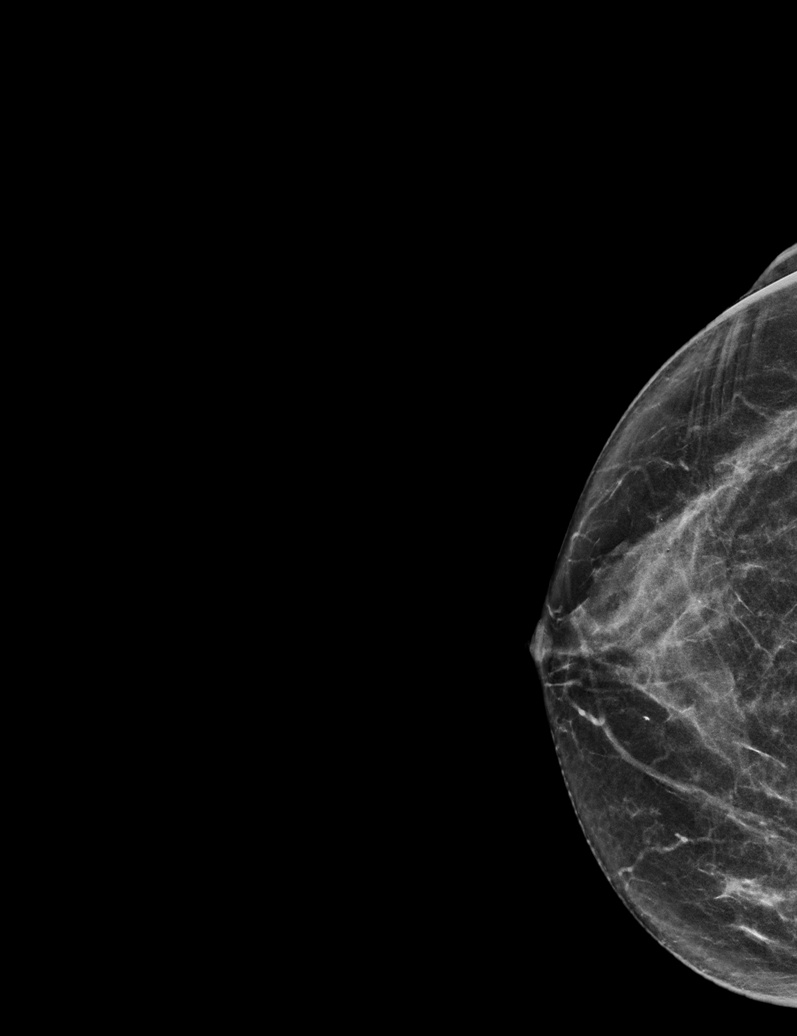

[R MLO tomo · tomo slice 28/55.0]
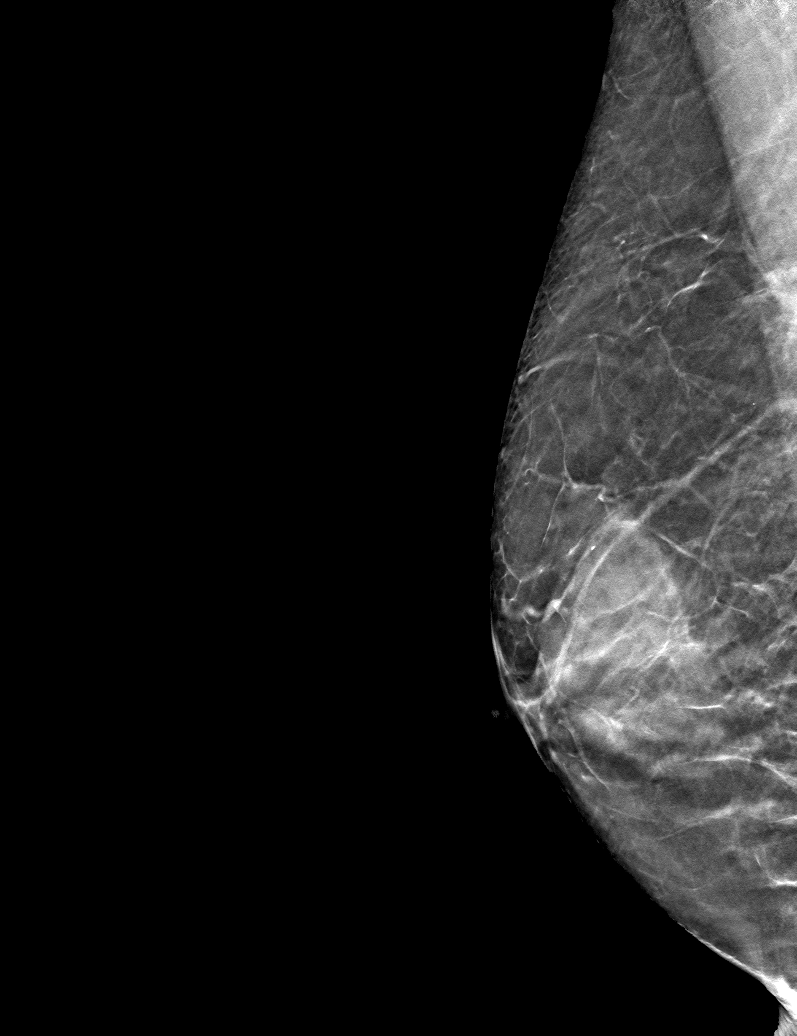

[6 of 30 positions shown; findings below may reference images not displayed]

ACR Breast Density Category c: The breast tissue is heterogeneously
dense, which may obscure small masses.
FINDINGS: There are no findings suspicious for malignancy.
IMPRESSION: No mammographic evidence of malignancy. A result letter of this
screening mammogram will be mailed directly to the patient.

RECOMMENDATION:
Screening mammogram in one year. (Code:Q3-W-BC3)

BI-RADS CATEGORY  1: Negative.

## 2022-08-16 ENCOUNTER — Other Ambulatory Visit: Payer: Self-pay | Admitting: Family Medicine

## 2022-08-16 DIAGNOSIS — Z1231 Encounter for screening mammogram for malignant neoplasm of breast: Secondary | ICD-10-CM

## 2022-09-12 ENCOUNTER — Ambulatory Visit
Admission: RE | Admit: 2022-09-12 | Discharge: 2022-09-12 | Disposition: A | Discharge status: Home / Self Care | Payer: Medicare PPO | Source: Ambulatory Visit | Attending: Family Medicine | Admitting: Family Medicine

## 2022-09-12 DIAGNOSIS — Z1231 Encounter for screening mammogram for malignant neoplasm of breast: Secondary | ICD-10-CM | POA: Diagnosis not present

## 2022-09-17 ENCOUNTER — Other Ambulatory Visit: Payer: Self-pay | Admitting: Family Medicine

## 2022-09-17 DIAGNOSIS — N6489 Other specified disorders of breast: Secondary | ICD-10-CM

## 2022-09-17 DIAGNOSIS — R928 Other abnormal and inconclusive findings on diagnostic imaging of breast: Secondary | ICD-10-CM

## 2022-09-18 ENCOUNTER — Ambulatory Visit
Admission: RE | Admit: 2022-09-18 | Discharge: 2022-09-18 | Disposition: A | Discharge status: Home / Self Care | Payer: Medicare PPO | Source: Ambulatory Visit | Attending: Family Medicine | Admitting: Family Medicine

## 2022-09-18 ENCOUNTER — Other Ambulatory Visit: Payer: Medicare PPO

## 2022-09-18 DIAGNOSIS — R928 Other abnormal and inconclusive findings on diagnostic imaging of breast: Secondary | ICD-10-CM | POA: Insufficient documentation

## 2022-09-18 DIAGNOSIS — N6489 Other specified disorders of breast: Secondary | ICD-10-CM

## 2023-08-25 ENCOUNTER — Other Ambulatory Visit: Payer: Self-pay | Admitting: Family Medicine

## 2023-08-25 DIAGNOSIS — Z1231 Encounter for screening mammogram for malignant neoplasm of breast: Secondary | ICD-10-CM

## 2023-09-19 ENCOUNTER — Encounter

## 2023-09-23 ENCOUNTER — Ambulatory Visit
Admission: RE | Admit: 2023-09-23 | Discharge: 2023-09-23 | Disposition: A | Discharge status: Home / Self Care | Source: Ambulatory Visit | Attending: Family Medicine | Admitting: Family Medicine

## 2023-09-23 DIAGNOSIS — Z1231 Encounter for screening mammogram for malignant neoplasm of breast: Secondary | ICD-10-CM | POA: Diagnosis present

## 2023-11-12 ENCOUNTER — Encounter: Payer: Self-pay | Admitting: Ophthalmology

## 2023-11-12 NOTE — Anesthesia Preprocedure Evaluation (Addendum)
 Anesthesia Evaluation  Patient identified by MRN, date of birth, ID band Patient awake    Reviewed: Allergy & Precautions, H&P , NPO status , Patient's Chart, lab work & pertinent test results  Airway Mallampati: III  TM Distance: <3 FB Neck ROM: Full    Dental no notable dental hx. (+) Caps Right upper central incisor is capped or crowned :   Pulmonary neg pulmonary ROS   Pulmonary exam normal breath sounds clear to auscultation       Cardiovascular negative cardio ROS Normal cardiovascular exam Rhythm:Regular Rate:Normal     Neuro/Psych  PSYCHIATRIC DISORDERS Anxiety Depression     Neuromuscular disease negative neurological ROS  negative psych ROS   GI/Hepatic negative GI ROS, Neg liver ROS,,,  Endo/Other  negative endocrine ROS    Renal/GU negative Renal ROS  negative genitourinary   Musculoskeletal negative musculoskeletal ROS (+) Arthritis ,    Abdominal   Peds negative pediatric ROS (+)  Hematology negative hematology ROS (+)   Anesthesia Other Findings Depression  Anxiety DJD (degenerative joint disease) Osteopenia Seasonal depression (HCC)  Pre-diabetes Neuropathy both feet   Reproductive/Obstetrics negative OB ROS                              Anesthesia Physical Anesthesia Plan  ASA: 3  Anesthesia Plan: MAC   Post-op Pain Management:    Induction: Intravenous  PONV Risk Score and Plan:   Airway Management Planned: Natural Airway and Nasal Cannula  Additional Equipment:   Intra-op Plan:   Post-operative Plan:   Informed Consent: I have reviewed the patients History and Physical, chart, labs and discussed the procedure including the risks, benefits and alternatives for the proposed anesthesia with the patient or authorized representative who has indicated his/her understanding and acceptance.     Dental Advisory Given  Plan Discussed with:  Anesthesiologist, CRNA and Surgeon  Anesthesia Plan Comments: (Patient consented for risks of anesthesia including but not limited to:  - adverse reactions to medications - damage to eyes, teeth, lips or other oral mucosa - nerve damage due to positioning  - sore throat or hoarseness - Damage to heart, brain, nerves, lungs, other parts of body or loss of life  Patient voiced understanding and assent.)         Anesthesia Quick Evaluation

## 2023-11-13 NOTE — Discharge Instructions (Signed)

## 2023-11-18 ENCOUNTER — Ambulatory Visit: Payer: Self-pay | Admitting: Anesthesiology

## 2023-11-18 ENCOUNTER — Encounter: Payer: Self-pay | Admitting: Ophthalmology

## 2023-11-18 ENCOUNTER — Ambulatory Visit
Admission: RE | Admit: 2023-11-18 | Discharge: 2023-11-18 | Disposition: A | Discharge status: Home / Self Care | Attending: Ophthalmology | Admitting: Ophthalmology

## 2023-11-18 ENCOUNTER — Other Ambulatory Visit: Payer: Self-pay

## 2023-11-18 ENCOUNTER — Encounter
Admission: RE | Disposition: A | Discharge status: Home / Self Care | Payer: Self-pay | Source: Home / Self Care | Attending: Ophthalmology

## 2023-11-18 DIAGNOSIS — H2511 Age-related nuclear cataract, right eye: Secondary | ICD-10-CM | POA: Diagnosis present

## 2023-11-18 DIAGNOSIS — R7303 Prediabetes: Secondary | ICD-10-CM | POA: Insufficient documentation

## 2023-11-18 HISTORY — PX: CATARACT EXTRACTION W/PHACO: SHX586

## 2023-11-18 HISTORY — DX: Prediabetes: R73.03

## 2023-11-18 HISTORY — DX: Unspecified mononeuropathy of bilateral lower limbs: G57.93

## 2023-11-18 SURGERY — PHACOEMULSIFICATION, CATARACT, WITH IOL INSERTION
Anesthesia: Monitor Anesthesia Care | Site: Eye | Laterality: Right

## 2023-11-18 MED ORDER — FENTANYL CITRATE (PF) 100 MCG/2ML IJ SOLN
INTRAMUSCULAR | Status: DC | PRN
  Administered 2023-11-18: 25 ug via INTRAVENOUS

## 2023-11-18 MED ORDER — ARMC OPHTHALMIC DILATING DROPS
OPHTHALMIC | Status: AC
  Filled 2023-11-18: qty 0.5

## 2023-11-18 MED ORDER — SIGHTPATH DOSE#1 NA CHONDROIT SULF-NA HYALURON 40-17 MG/ML IO SOLN
INTRAOCULAR | Status: DC | PRN
  Administered 2023-11-18: 1 mL via INTRAOCULAR

## 2023-11-18 MED ORDER — SIGHTPATH DOSE#1 BSS IO SOLN
INTRAOCULAR | Status: DC | PRN
  Administered 2023-11-18: 15 mL via INTRAOCULAR

## 2023-11-18 MED ORDER — MIDAZOLAM HCL 2 MG/2ML IJ SOLN
INTRAMUSCULAR | Status: AC
  Filled 2023-11-18: qty 2

## 2023-11-18 MED ORDER — FENTANYL CITRATE (PF) 100 MCG/2ML IJ SOLN
INTRAMUSCULAR | Status: AC
  Filled 2023-11-18: qty 2

## 2023-11-18 MED ORDER — LIDOCAINE HCL (PF) 2 % IJ SOLN
INTRAOCULAR | Status: DC | PRN
  Administered 2023-11-18: 2 mL

## 2023-11-18 MED ORDER — BRIMONIDINE TARTRATE-TIMOLOL 0.2-0.5 % OP SOLN
OPHTHALMIC | Status: DC | PRN
  Administered 2023-11-18: 1 [drp] via OPHTHALMIC

## 2023-11-18 MED ORDER — ARMC OPHTHALMIC DILATING DROPS
1.0000 | OPHTHALMIC | Status: DC | PRN
  Administered 2023-11-18 (×3): 1 via OPHTHALMIC

## 2023-11-18 MED ORDER — TETRACAINE HCL 0.5 % OP SOLN
1.0000 [drp] | OPHTHALMIC | Status: DC | PRN
  Administered 2023-11-18 (×3): 1 [drp] via OPHTHALMIC

## 2023-11-18 MED ORDER — LACTATED RINGERS IV SOLN: INTRAVENOUS | Status: DC

## 2023-11-18 MED ORDER — SIGHTPATH DOSE#1 BSS IO SOLN
INTRAOCULAR | Status: DC | PRN
  Administered 2023-11-18: 51 mL via OPHTHALMIC

## 2023-11-18 MED ORDER — MIDAZOLAM HCL 2 MG/2ML IJ SOLN
INTRAMUSCULAR | Status: DC | PRN
  Administered 2023-11-18: 1 mg via INTRAVENOUS

## 2023-11-18 MED ORDER — TETRACAINE HCL 0.5 % OP SOLN
OPHTHALMIC | Status: AC
  Filled 2023-11-18: qty 4

## 2023-11-18 MED ORDER — MOXIFLOXACIN HCL 0.5 % OP SOLN
OPHTHALMIC | Status: DC | PRN
  Administered 2023-11-18: .2 mL via OPHTHALMIC

## 2023-11-18 SURGICAL SUPPLY — 10 items
CANNULA ANT/CHMB 27G (MISCELLANEOUS) ×1 IMPLANT
CYSTOTOME ANGL RVRS SHRT 25G (CUTTER) ×1 IMPLANT
FEE CATARACT SUITE SIGHTPATH (MISCELLANEOUS) ×1 IMPLANT
GLOVE BIOGEL PI IND STRL 8 (GLOVE) ×1 IMPLANT
GLOVE SURG LX STRL 8.0 MICRO (GLOVE) ×1 IMPLANT
GLOVE SURG SYN 6.5 PF PI BL (GLOVE) ×1 IMPLANT
LENS IOL TECNIS EYHANCE 20.0 (Intraocular Lens) IMPLANT
NDL FILTER BLUNT 18X1 1/2 (NEEDLE) ×1 IMPLANT
NEEDLE FILTER BLUNT 18X1 1/2 (NEEDLE) ×1 IMPLANT
SYR 3ML LL SCALE MARK (SYRINGE) ×1 IMPLANT

## 2023-11-18 NOTE — H&P (Signed)
 [facility]   Primary Care Physician:  Diedra Lame, MD Ophthalmologist: Dr. Elsie Carmine  Pre-Procedure History & Physical: HPI:  Monica Everett is a 73 y.o. female here for cataract surgery.   Past Medical History:  Diagnosis Date   Anxiety    Depression    DJD (degenerative joint disease)    Neuropathy of both feet    Osteopenia    Pre-diabetes    diet controlled and excercise   Seasonal depression (HCC)     Past Surgical History:  Procedure Laterality Date   APPENDECTOMY     COLONOSCOPY WITH PROPOFOL  N/A 06/19/2015   Procedure: COLONOSCOPY WITH PROPOFOL ;  Surgeon: Lamar ONEIDA Holmes, MD;  Location: [facility] ENDOSCOPY;  Service: Endoscopy;  Laterality: N/A;    Prior to Admission medications   Medication Sig Start Date End Date Taking? Authorizing Provider  cetirizine (ZYRTEC) 10 MG tablet Take 10 mg by mouth daily.   Yes [provider]  citalopram (CELEXA) 10 MG tablet Take 20 mg by mouth daily.    Yes [provider]    Allergies as of 09/12/2023   (No Known Allergies)    Family History  Problem Relation Age of Onset   Breast cancer Neg Hx     Social History   Socioeconomic History   Marital status: Married    Spouse name: Not on file   Number of children: Not on file   Years of education: Not on file   Highest education level: Not on file  Occupational History   Not on file  Tobacco Use   Smoking status: Never   Smokeless tobacco: Never  Vaping Use   Vaping status: Never Used  Substance and Sexual Activity   Alcohol use: Not Currently    Alcohol/week: 2.0 standard drinks of alcohol    Types: 2 Shots of liquor per week    Comment: 2 per day   Drug use: No   Sexual activity: Not on file  Other Topics Concern   Not on file  Social History Narrative   Not on file   Social Drivers of Health   Financial Resource Strain: Patient Declined (06/26/2023)   Received from [facility]   Overall Financial  Resource Strain (CARDIA)    Difficulty of Paying Living Expenses: Patient declined  Food Insecurity: Patient Declined (06/26/2023)   [facility]   Hunger Vital Sign    Within the past 12 months, you worried that your food would run out before you got the money to buy more.: Patient declined    Within the past 12 months, the food you bought just didn't last and you didn't have money to get more.: Patient declined  Transportation Needs: Patient Declined (06/26/2023)   Received from [facility] - Transportation    In the past 12 months, has lack of transportation kept you from medical appointments or from getting medications?: Patient declined    Lack of Transportation (Non-Medical): Patient declined  Physical Activity: Not on file  Stress: Not on file  Social Connections: Not on file  Intimate Partner Violence: Not on file    Review of Systems: See HPI, otherwise negative ROS  Physical Exam: BP 112/72   Temp (!) 97.5 F (36.4 C) (Temporal)   Ht 5' 5 (1.651 m)   Wt 62.7 kg   SpO2 98%   BMI 23.00 kg/m  General:   Alert, cooperative. Head:  Normocephalic and atraumatic. Respiratory:  Normal work of breathing. Cardiovascular:  NAD  Impression/Plan: Monica Everett is here for cataract surgery.  Risks, benefits, limitations, and alternatives regarding cataract surgery have been reviewed with the patient.  Questions have been answered.  All parties agreeable.   Elsie Carmine, MD  11/18/2023, 7:44 AM

## 2023-11-18 NOTE — Transfer of Care (Signed)
 Immediate Anesthesia Transfer of Care Note  Patient: Monica Everett  Procedure(s) Performed: PHACOEMULSIFICATION, CATARACT, WITH IOL INSERTION 6.98 00:48.9 (Right: Eye)  Patient Location: PACU  Anesthesia Type: MAC  Level of Consciousness: awake, alert  and patient cooperative  Airway and Oxygen Therapy: Patient Spontanous Breathing and Patient connected to supplemental oxygen  Post-op Assessment: Post-op Vital signs reviewed, Patient's Cardiovascular Status Stable, Respiratory Function Stable, Patent Airway and No signs of Nausea or vomiting  Post-op Vital Signs: Reviewed and stable  Complications: No notable events documented.

## 2023-11-18 NOTE — Anesthesia Postprocedure Evaluation (Signed)
 Anesthesia Post Note  Patient: Monica Everett  Procedure(s) Performed: PHACOEMULSIFICATION, CATARACT, WITH IOL INSERTION 6.98 00:48.9 (Right: Eye)  Patient location during evaluation: PACU Anesthesia Type: MAC Level of consciousness: awake and alert Pain management: pain level controlled Vital Signs Assessment: post-procedure vital signs reviewed and stable Respiratory status: spontaneous breathing, nonlabored ventilation, respiratory function stable and patient connected to nasal cannula oxygen Cardiovascular status: stable and blood pressure returned to baseline Postop Assessment: no apparent nausea or vomiting Anesthetic complications: no   No notable events documented.   Last Vitals:  Vitals:   11/18/23 0810 11/18/23 0812  BP: 117/66 103/65  Pulse: (!) 56 (!) 55  Resp: 16 11  Temp:  (!) 36.1 C  SpO2: 96% 95%    Last Pain:  Vitals:   11/18/23 0812  TempSrc:   PainSc: 0-No pain                 Donny JAYSON Mu

## 2023-11-18 NOTE — Op Note (Signed)
 PREOPERATIVE DIAGNOSIS:  Nuclear sclerotic cataract of the right eye.   POSTOPERATIVE DIAGNOSIS:  Right Eye Cataract   OPERATIVE PROCEDURE:ORPROCALL@   SURGEON:  Elsie Carmine, MD.   ANESTHESIA:  Anesthesiologist: Ola Donny BROCKS, MD CRNA: Jahoo, Sonia, CRNA  1.      Managed anesthesia care. 2.      0.35ml of Shugarcaine was instilled in the eye following the paracentesis.   COMPLICATIONS:  None.   TECHNIQUE:   Stop and chop   DESCRIPTION OF PROCEDURE:  The patient was examined and consented in the preoperative holding area where the aforementioned topical anesthesia was applied to the right eye and then brought back to the Operating Room where the right eye was prepped and draped in the usual sterile ophthalmic fashion and a lid speculum was placed. A paracentesis was created with the side port blade and the anterior chamber was filled with viscoelastic. A near clear corneal incision was performed with the steel keratome. A continuous curvilinear capsulorrhexis was performed with a cystotome followed by the capsulorrhexis forceps. Hydrodissection and hydrodelineation were carried out with BSS on a blunt cannula. The lens was removed in a stop and chop  technique and the remaining cortical material was removed with the irrigation-aspiration handpiece. The capsular bag was inflated with viscoelastic and the intraocular lens was placed in the capsular bag without complication. The remaining viscoelastic was removed from the eye with the irrigation-aspiration handpiece. The wounds were hydrated. The anterior chamber was flushed with BSS and the eye was inflated to physiologic pressure. 0.76ml of Vigamox  was placed in the anterior chamber. The wounds were found to be water tight. The eye was dressed with Combigan . The patient was given protective glasses to wear throughout the day and a shield with which to sleep tonight. The patient was also given drops with which to begin a drop regimen today and  will follow-up with me in one day. Implant Name Type Inv. Item Serial No. Manufacturer Lot No. LRB No. Used Action  LENS IOL TECNIS EYHANCE 20.0 - [id number] Intraocular Lens LENS IOL TECNIS EYHANCE 20.0 [id number] [employer]  Right 1 Implanted   Procedure(s): PHACOEMULSIFICATION, CATARACT, WITH IOL INSERTION 6.98 00:48.9 (Right)  Electronically signed: Elsie Carmine 11/18/2023 8:06 AM

## 2023-11-19 ENCOUNTER — Other Ambulatory Visit: Payer: Self-pay

## 2023-11-19 ENCOUNTER — Encounter: Payer: Self-pay | Admitting: Ophthalmology

## 2023-11-24 ENCOUNTER — Encounter: Payer: Self-pay | Admitting: Ophthalmology

## 2023-11-28 NOTE — Discharge Instructions (Signed)

## 2023-12-01 NOTE — Anesthesia Preprocedure Evaluation (Signed)
 Anesthesia Evaluation  Patient identified by MRN, date of birth, ID band Patient awake    Reviewed: Allergy & Precautions, H&P , NPO status , Patient's Chart, lab work & pertinent test results  Airway Mallampati: III  TM Distance: <3 FB Neck ROM: Full    Dental no notable dental hx.  Caps Right upper central incisor is capped or crowned :  :   Pulmonary neg pulmonary ROS   Pulmonary exam normal breath sounds clear to auscultation       Cardiovascular negative cardio ROS Normal cardiovascular exam Rhythm:Regular Rate:Normal     Neuro/Psych  PSYCHIATRIC DISORDERS Anxiety Depression     Neuromuscular disease negative neurological ROS  negative psych ROS   GI/Hepatic negative GI ROS, Neg liver ROS,,,  Endo/Other  negative endocrine ROS    Renal/GU negative Renal ROS  negative genitourinary   Musculoskeletal negative musculoskeletal ROS (+) Arthritis ,    Abdominal   Peds negative pediatric ROS (+)  Hematology negative hematology ROS (+)   Anesthesia Other Findings Previous cataract surgery 11-18-23 Dr. Ola  Depression  Anxiety DJD (degenerative joint disease) Osteopenia Seasonal depression  Pre-diabetes Neuropathy of both feet      Reproductive/Obstetrics negative OB ROS                              Anesthesia Physical Anesthesia Plan  ASA: 3  Anesthesia Plan: MAC   Post-op Pain Management:    Induction: Intravenous  PONV Risk Score and Plan:   Airway Management Planned: Natural Airway and Nasal Cannula  Additional Equipment:   Intra-op Plan:   Post-operative Plan:   Informed Consent: I have reviewed the patients History and Physical, chart, labs and discussed the procedure including the risks, benefits and alternatives for the proposed anesthesia with the patient or authorized representative who has indicated his/her understanding and acceptance.     Dental  Advisory Given  Plan Discussed with: Anesthesiologist, CRNA and Surgeon  Anesthesia Plan Comments: (Patient consented for risks of anesthesia including but not limited to:  - adverse reactions to medications - damage to eyes, teeth, lips or other oral mucosa - nerve damage due to positioning  - sore throat or hoarseness - Damage to heart, brain, nerves, lungs, other parts of body or loss of life  Patient voiced understanding and assent.)         Anesthesia Quick Evaluation

## 2023-12-02 ENCOUNTER — Ambulatory Visit
Admission: RE | Admit: 2023-12-02 | Discharge: 2023-12-02 | Disposition: A | Discharge status: Home / Self Care | Attending: Ophthalmology | Admitting: Ophthalmology

## 2023-12-02 ENCOUNTER — Ambulatory Visit: Payer: Self-pay | Admitting: Anesthesiology

## 2023-12-02 ENCOUNTER — Encounter: Payer: Self-pay | Admitting: Ophthalmology

## 2023-12-02 ENCOUNTER — Other Ambulatory Visit: Payer: Self-pay

## 2023-12-02 ENCOUNTER — Encounter
Admission: RE | Disposition: A | Discharge status: Home / Self Care | Payer: Self-pay | Source: Home / Self Care | Attending: Ophthalmology

## 2023-12-02 DIAGNOSIS — Z79899 Other long term (current) drug therapy: Secondary | ICD-10-CM | POA: Diagnosis not present

## 2023-12-02 DIAGNOSIS — F32A Depression, unspecified: Secondary | ICD-10-CM | POA: Insufficient documentation

## 2023-12-02 DIAGNOSIS — Z9841 Cataract extraction status, right eye: Secondary | ICD-10-CM | POA: Diagnosis not present

## 2023-12-02 DIAGNOSIS — H2512 Age-related nuclear cataract, left eye: Secondary | ICD-10-CM | POA: Insufficient documentation

## 2023-12-02 DIAGNOSIS — M1990 Unspecified osteoarthritis, unspecified site: Secondary | ICD-10-CM | POA: Diagnosis not present

## 2023-12-02 DIAGNOSIS — Z961 Presence of intraocular lens: Secondary | ICD-10-CM | POA: Insufficient documentation

## 2023-12-02 DIAGNOSIS — F419 Anxiety disorder, unspecified: Secondary | ICD-10-CM | POA: Insufficient documentation

## 2023-12-02 HISTORY — PX: CATARACT EXTRACTION W/PHACO: SHX586

## 2023-12-02 SURGERY — PHACOEMULSIFICATION, CATARACT, WITH IOL INSERTION
Anesthesia: Monitor Anesthesia Care | Site: Eye | Laterality: Left

## 2023-12-02 MED ORDER — TETRACAINE HCL 0.5 % OP SOLN
OPHTHALMIC | Status: AC
  Filled 2023-12-02: qty 4

## 2023-12-02 MED ORDER — MIDAZOLAM HCL 2 MG/2ML IJ SOLN
INTRAMUSCULAR | Status: DC | PRN
  Administered 2023-12-02: 1 mg via INTRAVENOUS

## 2023-12-02 MED ORDER — LACTATED RINGERS IV SOLN
INTRAVENOUS | Status: DC
Start: 2023-12-02 — End: 2023-12-02

## 2023-12-02 MED ORDER — BRIMONIDINE TARTRATE-TIMOLOL 0.2-0.5 % OP SOLN
OPHTHALMIC | Status: DC | PRN
  Administered 2023-12-02: 1 [drp] via OPHTHALMIC

## 2023-12-02 MED ORDER — SIGHTPATH DOSE#1 BSS IO SOLN
INTRAOCULAR | Status: DC | PRN
  Administered 2023-12-02: 15 mL via INTRAOCULAR

## 2023-12-02 MED ORDER — ARMC OPHTHALMIC DILATING DROPS
1.0000 | OPHTHALMIC | Status: AC
  Administered 2023-12-02 (×2): 1 via OPHTHALMIC

## 2023-12-02 MED ORDER — MOXIFLOXACIN HCL 0.5 % OP SOLN
OPHTHALMIC | Status: DC | PRN
  Administered 2023-12-02: .2 mL via OPHTHALMIC

## 2023-12-02 MED ORDER — TETRACAINE HCL 0.5 % OP SOLN
1.0000 [drp] | OPHTHALMIC | Status: AC | PRN
  Administered 2023-12-02 (×3): 1 [drp] via OPHTHALMIC

## 2023-12-02 MED ORDER — MIDAZOLAM HCL 2 MG/2ML IJ SOLN
INTRAMUSCULAR | Status: AC
  Filled 2023-12-02: qty 2

## 2023-12-02 MED ORDER — SIGHTPATH DOSE#1 BSS IO SOLN
INTRAOCULAR | Status: DC | PRN
  Administered 2023-12-02: 40 mL via OPHTHALMIC

## 2023-12-02 MED ORDER — FENTANYL CITRATE (PF) 100 MCG/2ML IJ SOLN
INTRAMUSCULAR | Status: AC
  Filled 2023-12-02: qty 2

## 2023-12-02 MED ORDER — ARMC OPHTHALMIC DILATING DROPS
OPHTHALMIC | Status: AC
  Filled 2023-12-02: qty 0.5

## 2023-12-02 MED ORDER — SIGHTPATH DOSE#1 NA CHONDROIT SULF-NA HYALURON 40-17 MG/ML IO SOLN
INTRAOCULAR | Status: DC | PRN
  Administered 2023-12-02: 1 mL via INTRAOCULAR

## 2023-12-02 MED ORDER — FENTANYL CITRATE (PF) 100 MCG/2ML IJ SOLN
INTRAMUSCULAR | Status: DC | PRN
  Administered 2023-12-02: 25 ug via INTRAVENOUS

## 2023-12-02 MED ORDER — LIDOCAINE HCL (PF) 2 % IJ SOLN
INTRAOCULAR | Status: DC | PRN
  Administered 2023-12-02: 2 mL

## 2023-12-02 SURGICAL SUPPLY — 10 items
CANNULA ANT/CHMB 27G (MISCELLANEOUS) ×1 IMPLANT
CYSTOTOME ANGL RVRS SHRT 25G (CUTTER) ×1 IMPLANT
FEE CATARACT SUITE SIGHTPATH (MISCELLANEOUS) ×1 IMPLANT
GLOVE BIOGEL PI IND STRL 8 (GLOVE) ×1 IMPLANT
GLOVE SURG LX STRL 8.0 MICRO (GLOVE) ×1 IMPLANT
GLOVE SURG SYN 6.5 PF PI BL (GLOVE) ×1 IMPLANT
LENS IOL TECNIS EYHANCE 21.5 (Intraocular Lens) IMPLANT
NDL FILTER BLUNT 18X1 1/2 (NEEDLE) ×1 IMPLANT
NEEDLE FILTER BLUNT 18X1 1/2 (NEEDLE) ×1 IMPLANT
SYR 3ML LL SCALE MARK (SYRINGE) ×1 IMPLANT

## 2023-12-02 NOTE — Anesthesia Postprocedure Evaluation (Signed)
 Anesthesia Post Note  Patient: Monica Everett  Procedure(s) Performed: PHACOEMULSIFICATION, CATARACT, WITH IOL INSERTION 8.13 00:46.5 (Left: Eye)  Patient location during evaluation: PACU Anesthesia Type: MAC Level of consciousness: awake and alert Pain management: pain level controlled Vital Signs Assessment: post-procedure vital signs reviewed and stable Respiratory status: spontaneous breathing, nonlabored ventilation, respiratory function stable and patient connected to nasal cannula oxygen Cardiovascular status: stable and blood pressure returned to baseline Postop Assessment: no apparent nausea or vomiting Anesthetic complications: no   No notable events documented.   Last Vitals:  Vitals:   12/02/23 1032 12/02/23 1033  BP: 115/73   Pulse:  (!) 56  Resp:  11  Temp:  36.8 C  SpO2:  96%    Last Pain:  Vitals:   12/02/23 1033  TempSrc:   PainSc: 0-No pain                 Nessa Ramaker C [place]

## 2023-12-02 NOTE — Transfer of Care (Signed)
 Immediate Anesthesia Transfer of Care Note  Patient: Monica Everett  Procedure(s) Performed: PHACOEMULSIFICATION, CATARACT, WITH IOL INSERTION 8.13 00:46.5 (Left: Eye)  Patient Location: PACU  Anesthesia Type: MAC  Level of Consciousness: awake, alert  and patient cooperative  Airway and Oxygen Therapy: Patient Spontanous Breathing and Patient connected to supplemental oxygen  Post-op Assessment: Post-op Vital signs reviewed, Patient's Cardiovascular Status Stable, Respiratory Function Stable, Patent Airway and No signs of Nausea or vomiting  Post-op Vital Signs: Reviewed and stable  Complications: No notable events documented.

## 2023-12-02 NOTE — H&P (Signed)
 [facility]   Primary Care Physician:  Diedra Lame, MD Ophthalmologist: Dr. Elsie Carmine  Pre-Procedure History & Physical: HPI:  Monica Everett is a 73 y.o. female here for cataract surgery.   Past Medical History:  Diagnosis Date   Anxiety    Depression    DJD (degenerative joint disease)    Neuropathy of both feet    Osteopenia    Pre-diabetes    diet controlled and excercise   Seasonal depression     Past Surgical History:  Procedure Laterality Date   APPENDECTOMY     CATARACT EXTRACTION W/PHACO Right 11/18/2023   Procedure: PHACOEMULSIFICATION, CATARACT, WITH IOL INSERTION 6.98 00:48.9;  Surgeon: Carmine Elsie, MD;  Location: [facility];  Service: Ophthalmology;  Laterality: Right;   COLONOSCOPY WITH PROPOFOL  N/A 06/19/2015   Procedure: COLONOSCOPY WITH PROPOFOL ;  Surgeon: Lamar ONEIDA Holmes, MD;  Location: [facility];  Service: Endoscopy;  Laterality: N/A;    Prior to Admission medications   Medication Sig Start Date End Date Taking? Authorizing Provider  cetirizine (ZYRTEC) 10 MG tablet Take 10 mg by mouth daily.   Yes [provider]  citalopram (CELEXA) 10 MG tablet Take 20 mg by mouth daily.    Yes [provider]    Allergies as of 09/12/2023   (No Known Allergies)    Family History  Problem Relation Age of Onset   Breast cancer Neg Hx     Social History   Socioeconomic History   Marital status: Married    Spouse name: Not on file   Number of children: Not on file   Years of education: Not on file   Highest education level: Not on file  Occupational History   Not on file  Tobacco Use   Smoking status: Never   Smokeless tobacco: Never  Vaping Use   Vaping status: Never Used  Substance and Sexual Activity   Alcohol use: Not Currently    Alcohol/week: 2.0 standard drinks of alcohol    Types: 2 Shots of liquor per week    Comment: 2 per day   Drug use: No   Sexual activity: Not on file  Other  Topics Concern   Not on file  Social History Narrative   Not on file   Social Drivers of Health   Financial Resource Strain: Patient Declined (06/26/2023)   Received from [facility]   Overall Financial Resource Strain (CARDIA)    Difficulty of Paying Living Expenses: Patient declined  Food Insecurity: Patient Declined (06/26/2023)   Received from [facility]   Hunger Vital Sign    Within the past 12 months, you worried that your food would run out before you got the money to buy more.: Patient declined    Within the past 12 months, the food you bought just didn't last and you didn't have money to get more.: Patient declined  Transportation Needs: Patient Declined (06/26/2023)   Received from [facility] - Transportation    In the past 12 months, has lack of transportation kept you from medical appointments or from getting medications?: Patient declined    Lack of Transportation (Non-Medical): Patient declined  Physical Activity: Not on file  Stress: Not on file  Social Connections: Not on file  Intimate Partner Violence: Not on file    Review of Systems: See HPI, otherwise negative ROS  Physical Exam: BP 117/68   Pulse (!) 59   Temp 97.9 F (  36.6 C) (Temporal)   Resp 15   Ht 5' 5 (1.651 m)   Wt 60.4 kg   SpO2 98%   BMI 22.17 kg/m  General:   Alert, cooperative. Head:  Normocephalic and atraumatic. Respiratory:  Normal work of breathing. Cardiovascular:  NAD  Impression/Plan: Monica Everett is here for cataract surgery.  Risks, benefits, limitations, and alternatives regarding cataract surgery have been reviewed with the patient.  Questions have been answered.  All parties agreeable.   Elsie Carmine, MD  12/02/2023, 10:08 AM

## 2023-12-02 NOTE — Op Note (Signed)
 PREOPERATIVE DIAGNOSIS:  Nuclear sclerotic cataract of the left eye.   POSTOPERATIVE DIAGNOSIS:  Nuclear sclerotic cataract of the left eye.   OPERATIVE PROCEDURE:ORPROCALL@   SURGEON:  Elsie Carmine, MD.   ANESTHESIA:  Anesthesiologist: Ola Donny BROCKS, MD CRNA: Jahoo, Sonia, CRNA  1.      Managed anesthesia care. 2.     0.38ml of Shugarcaine was instilled following the paracentesis   COMPLICATIONS:  None.   TECHNIQUE:   Stop and chop   DESCRIPTION OF PROCEDURE:  The patient was examined and consented in the preoperative holding area where the aforementioned topical anesthesia was applied to the left eye and then brought back to the Operating Room where the left eye was prepped and draped in the usual sterile ophthalmic fashion and a lid speculum was placed. A paracentesis was created with the side port blade and the anterior chamber was filled with viscoelastic. A near clear corneal incision was performed with the steel keratome. A continuous curvilinear capsulorrhexis was performed with a cystotome followed by the capsulorrhexis forceps. Hydrodissection and hydrodelineation were carried out with BSS on a blunt cannula. The lens was removed in a stop and chop  technique and the remaining cortical material was removed with the irrigation-aspiration handpiece. The capsular bag was inflated with viscoelastic and the intraocular lens was placed in the capsular bag without complication. The remaining viscoelastic was removed from the eye with the irrigation-aspiration handpiece. The wounds were hydrated. The anterior chamber was flushed with BSS and the eye was inflated to physiologic pressure. 0.29ml Vigamox  was placed in the anterior chamber. The wounds were found to be water tight. The eye was dressed with Combigan . The patient was given protective glasses to wear throughout the day and a shield with which to sleep tonight. The patient was also given drops with which to begin a drop regimen  today and will follow-up with me in one day. Implant Name Type Inv. Item Serial No. Manufacturer Lot No. LRB No. Used Action  LENS IOL TECNIS EYHANCE 21.5 - [id number] Intraocular Lens LENS IOL TECNIS EYHANCE 21.5 [id number] [employer]  Left 1 Implanted    Procedure(s): PHACOEMULSIFICATION, CATARACT, WITH IOL INSERTION 8.13 00:46.5 (Left)  Electronically signed: Elsie Carmine 12/02/2023 10:31 AM
